# Patient Record
Sex: Female | Born: 1960 | Race: White | Hispanic: No | Marital: Married | Smoking: Never smoker
Health system: Southern US, Community
[De-identification: ages and names within clinical notes are randomized; demographics above are authoritative.]

## PROBLEM LIST (undated history)

## (undated) DIAGNOSIS — R002 Palpitations: Secondary | ICD-10-CM

## (undated) DIAGNOSIS — G4752 REM sleep behavior disorder: Secondary | ICD-10-CM

## (undated) DIAGNOSIS — R011 Cardiac murmur, unspecified: Secondary | ICD-10-CM

## (undated) DIAGNOSIS — G4733 Obstructive sleep apnea (adult) (pediatric): Secondary | ICD-10-CM

## (undated) DIAGNOSIS — B029 Zoster without complications: Secondary | ICD-10-CM

## (undated) HISTORY — DX: Palpitations: R00.2

## (undated) HISTORY — PX: TONSILLECTOMY: SUR1361

## (undated) HISTORY — PX: TOTAL ABDOMINAL HYSTERECTOMY: SHX209

## (undated) HISTORY — DX: Zoster without complications: B02.9

## (undated) HISTORY — DX: Obstructive sleep apnea (adult) (pediatric): G47.33

## (undated) HISTORY — DX: REM sleep behavior disorder: G47.52

## (undated) HISTORY — DX: Cardiac murmur, unspecified: R01.1

---

## 2000-05-16 ENCOUNTER — Ambulatory Visit (HOSPITAL_COMMUNITY): Admission: RE | Admit: 2000-05-16 | Discharge: 2000-05-16 | Payer: Self-pay | Admitting: Family Medicine

## 2001-02-14 ENCOUNTER — Encounter (INDEPENDENT_AMBULATORY_CARE_PROVIDER_SITE_OTHER): Payer: Self-pay | Admitting: *Deleted

## 2001-02-14 ENCOUNTER — Ambulatory Visit (HOSPITAL_COMMUNITY): Admission: RE | Admit: 2001-02-14 | Discharge: 2001-02-14 | Payer: Self-pay | Admitting: Gastroenterology

## 2001-07-18 ENCOUNTER — Other Ambulatory Visit: Admission: RE | Admit: 2001-07-18 | Discharge: 2001-07-18 | Payer: Self-pay | Admitting: Obstetrics and Gynecology

## 2001-10-21 DIAGNOSIS — G4730 Sleep apnea, unspecified: Secondary | ICD-10-CM | POA: Insufficient documentation

## 2001-12-12 ENCOUNTER — Ambulatory Visit (HOSPITAL_BASED_OUTPATIENT_CLINIC_OR_DEPARTMENT_OTHER): Admission: RE | Admit: 2001-12-12 | Discharge: 2001-12-12 | Payer: Self-pay | Admitting: *Deleted

## 2002-08-10 ENCOUNTER — Other Ambulatory Visit: Admission: RE | Admit: 2002-08-10 | Discharge: 2002-08-10 | Payer: Self-pay | Admitting: Obstetrics and Gynecology

## 2003-03-18 ENCOUNTER — Encounter (INDEPENDENT_AMBULATORY_CARE_PROVIDER_SITE_OTHER): Payer: Self-pay | Admitting: *Deleted

## 2003-03-18 ENCOUNTER — Ambulatory Visit (HOSPITAL_COMMUNITY): Admission: RE | Admit: 2003-03-18 | Discharge: 2003-03-18 | Payer: Self-pay | Admitting: Obstetrics and Gynecology

## 2003-09-21 ENCOUNTER — Other Ambulatory Visit: Admission: RE | Admit: 2003-09-21 | Discharge: 2003-09-21 | Payer: Self-pay | Admitting: Obstetrics and Gynecology

## 2003-09-27 ENCOUNTER — Encounter: Admission: RE | Admit: 2003-09-27 | Discharge: 2003-09-27 | Payer: Self-pay | Admitting: Obstetrics and Gynecology

## 2003-12-31 ENCOUNTER — Ambulatory Visit (HOSPITAL_BASED_OUTPATIENT_CLINIC_OR_DEPARTMENT_OTHER): Admission: RE | Admit: 2003-12-31 | Discharge: 2003-12-31 | Payer: Self-pay | Admitting: Internal Medicine

## 2005-01-31 ENCOUNTER — Ambulatory Visit: Payer: Self-pay | Admitting: Internal Medicine

## 2005-04-26 ENCOUNTER — Other Ambulatory Visit: Admission: RE | Admit: 2005-04-26 | Discharge: 2005-04-26 | Payer: Self-pay | Admitting: Obstetrics and Gynecology

## 2005-10-03 ENCOUNTER — Ambulatory Visit: Payer: Self-pay | Admitting: Internal Medicine

## 2006-01-17 ENCOUNTER — Encounter: Admission: RE | Admit: 2006-01-17 | Discharge: 2006-01-17 | Payer: Self-pay | Admitting: Obstetrics and Gynecology

## 2006-02-11 ENCOUNTER — Ambulatory Visit: Payer: Self-pay | Admitting: Cardiology

## 2006-02-15 ENCOUNTER — Ambulatory Visit: Payer: Self-pay | Admitting: Cardiology

## 2006-04-09 ENCOUNTER — Ambulatory Visit: Payer: Self-pay

## 2006-04-09 ENCOUNTER — Encounter: Payer: Self-pay | Admitting: Cardiovascular Disease

## 2006-04-10 ENCOUNTER — Ambulatory Visit: Payer: Self-pay | Admitting: Cardiology

## 2006-10-28 ENCOUNTER — Encounter: Admission: RE | Admit: 2006-10-28 | Discharge: 2006-10-28 | Payer: Self-pay | Admitting: Obstetrics and Gynecology

## 2007-01-07 ENCOUNTER — Ambulatory Visit (HOSPITAL_COMMUNITY): Admission: RE | Admit: 2007-01-07 | Discharge: 2007-01-08 | Payer: Self-pay | Admitting: Obstetrics and Gynecology

## 2007-01-07 ENCOUNTER — Encounter (INDEPENDENT_AMBULATORY_CARE_PROVIDER_SITE_OTHER): Payer: Self-pay | Admitting: Specialist

## 2007-01-22 ENCOUNTER — Ambulatory Visit: Payer: Self-pay | Admitting: Internal Medicine

## 2007-04-14 ENCOUNTER — Ambulatory Visit: Payer: Self-pay | Admitting: Internal Medicine

## 2007-08-12 ENCOUNTER — Encounter: Admission: RE | Admit: 2007-08-12 | Discharge: 2007-08-12 | Payer: Self-pay | Admitting: Obstetrics and Gynecology

## 2007-09-03 ENCOUNTER — Encounter: Admission: RE | Admit: 2007-09-03 | Discharge: 2007-09-03 | Payer: Self-pay | Admitting: Obstetrics and Gynecology

## 2007-10-28 DIAGNOSIS — I080 Rheumatic disorders of both mitral and aortic valves: Secondary | ICD-10-CM

## 2007-10-28 DIAGNOSIS — H1045 Other chronic allergic conjunctivitis: Secondary | ICD-10-CM

## 2007-10-28 DIAGNOSIS — I4949 Other premature depolarization: Secondary | ICD-10-CM

## 2007-10-28 DIAGNOSIS — G4733 Obstructive sleep apnea (adult) (pediatric): Secondary | ICD-10-CM

## 2007-10-28 DIAGNOSIS — E785 Hyperlipidemia, unspecified: Secondary | ICD-10-CM

## 2007-10-28 DIAGNOSIS — Z8679 Personal history of other diseases of the circulatory system: Secondary | ICD-10-CM | POA: Insufficient documentation

## 2007-12-18 ENCOUNTER — Ambulatory Visit: Payer: Self-pay | Admitting: Internal Medicine

## 2007-12-20 DIAGNOSIS — G4752 REM sleep behavior disorder: Secondary | ICD-10-CM

## 2008-02-25 ENCOUNTER — Encounter: Payer: Self-pay | Admitting: Internal Medicine

## 2008-03-24 ENCOUNTER — Encounter: Payer: Self-pay | Admitting: Internal Medicine

## 2008-05-17 ENCOUNTER — Encounter: Admission: RE | Admit: 2008-05-17 | Discharge: 2008-05-17 | Payer: Self-pay | Admitting: Obstetrics and Gynecology

## 2009-08-12 ENCOUNTER — Encounter: Admission: RE | Admit: 2009-08-12 | Discharge: 2009-08-12 | Payer: Self-pay | Admitting: Obstetrics and Gynecology

## 2010-03-10 ENCOUNTER — Ambulatory Visit: Payer: Self-pay | Admitting: Internal Medicine

## 2010-03-10 LAB — CONVERTED CEMR LAB
Cholesterol, target level: 200 mg/dL
HDL goal, serum: 40 mg/dL
LDL Goal: 160 mg/dL

## 2010-09-11 ENCOUNTER — Telehealth (INDEPENDENT_AMBULATORY_CARE_PROVIDER_SITE_OTHER): Payer: Self-pay | Admitting: *Deleted

## 2010-10-26 NOTE — Progress Notes (Signed)
Summary: clonazepam rx > LMTCB x1  Phone Note Call from Patient Call back at Home Phone [phone]   Caller: Patient Call For: Dr Maple Hudson Summary of Call: Patient phoned stated that her pharmacy [facility] [phone] fax [phone]. Pharmacy told the patient that they have faxed [place] the request for Clonazepam .5 mg patient can be reached at her cell [phone] Initial call taken by: Vedia Coffer,  September 11, 2010 4:05 PM  Follow-up for Phone Call        please advise if ok to refill. Pt last seen on 03-10-2010 with directions to follow-up in one year. Carron Curie CMA  September 11, 2010 4:58 PM   Additional Follow-up for Phone Call Additional follow up Details #1::        OK to refill the clonazepam script we gave in June. Additional Follow-up by: Waymon Budge MD,  September 11, 2010 5:27 PM    Additional Follow-up for Phone Call Additional follow up Details #2::    LMOM TCB for pt to inform her of pending refills.  refills telephoned to [facility] via pharmacist. Boone Master CNA/MA  September 11, 2010 5:36 PM   Spoke with pt and notified that rx was sent.  She was already aware as she picked this up last night.  Follow-up by: Vernie Murders,  September 12, 2010 5:36 PM  Prescriptions: CLONAZEPAM 0.5 MG  TABS (CLONAZEPAM) Take 1-2 tabs at bedtime as needed for occasional use.  #60 x 5   Entered by:   Boone Master CNA/MA   Authorized by:   Waymon Budge MD   Signed by:   Boone Master CNA/MA on 09/11/2010   Method used:   Telephoned to ...       [facility] [employer] [facility] (retail)       [address]       [place], [place]  [address]       Ph: [phone]       Fax: [phone]   RxID:   [id number]

## 2010-10-26 NOTE — Assessment & Plan Note (Signed)
Summary: rov/meds/apc   Primary Provider/Referring Provider:  C. Miguel Aschoff [facility]  CC:  ROV/Meds and pt c/o sleep walking and nightmares even taking clonazepam....Marland Kitchenno other complaints.  History of Present Illness: 12/18/07 OBSTRUCTIVE SLEEP APNEA (ICD-327.23) Returns to follow up chronic difficulty maintaining sleep, complicated by movement, activity and vocalization in sleep thought best explained As REM Behavior Disorder, along with sleep apnea. clonazepam has wporked best of therapies tried, taking it at 830 PM. Stressfull times are still apt to trigger some breakthrough but not intense as in past. Clonazepam may be causing some concentration difficulty at work. Previously tried Vivactil, Tofranil, doxepin. [facility]. NPSG was 12/31/03. Allergies acting up with eyes and nose watering, nasal congestion. Tylenol allergy and sinus product not much help. Med talk done.  March 10, 2010- OSA, REM Behavior Disorder Still sleep walking, screams and cries out in sleep, Denies incontinence or injury.. Varies with the stress level of the day.  Changed job= promotion. Several deaths in family. She doesn't use CPAP. She is much better taking clonazepam than not. 2 tabs may not be enough. Needs to take it at [facility] to avoid hangover next morning. Her best sleep came in allergy season when she added benadryl to clonazepam. Family sleep down the hall to avoid her noise at night.She likes sleeping alone.  Now on propranolol 60 mg daily for palpitations. Discussed beta-blocker tendency to increase dreaming.     Lipid Management History:      Negative NCEP/ATP III risk factors include female age less than 72 years old and non-tobacco-user status.    Preventive Screening-Counseling & Management  Alcohol-Tobacco     Smoking Status: never  Current Medications (verified): 1)  Clonazepam 0.5 Mg  Tabs (Clonazepam) .... Take 1-2 Tabs At Bedtime As Needed For Occasional Use. 2)   Alprazolam 0.25 Mg  Tabs (Alprazolam) .Marland Kitchen.. 1 At Bedtime As Needed  Allergies (verified): 1)  Asa 2)  Nsaids 3)  Sulfa  Past History:  Family History: Last updated: 03/10/2010 Parents living Father was a sleep waljer into his teens Mother -active dreamer  Social History: Last updated: 03/10/2010 Patient never smoked.  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 Allergic rhinitis Palpitations Migraine  Past Surgical History: Total Abdominal Hysterectomy Tonsillectomy  Family History: Parents living Father was a sleep waljer into his teens Mother -active dreamer  Social History: Patient never smoked.  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  The patient denies anorexia, fever, weight loss, vision loss, decreased hearing, hoarseness, chest pain, syncope, dyspnea on exertion, peripheral edema, prolonged cough, headaches, hemoptysis, abdominal pain, melena, hematochezia, and severe indigestion/heartburn.    Vital Signs:  Patient profile:   50 year old female Height:      68 inches Weight:      208 pounds BMI:     31.74 O2 Sat:      99 % on Room air Pulse rate:   63 / minute BP sitting:   128 / 80  (left arm) Cuff size:   regular  Vitals Entered By: Denna Haggard, CMA (March 10, 2010 1:37 PM)  O2 Sat at  Rest %:  99% O2 Flow:  Room air  Reason for Visit Rov/ meds  Physical Exam  Additional Exam:  General: A/Ox3; pleasant and cooperative, NAD, weight gain SKIN: no rash, lesions NODES: no lymphadenopathy HEENT: /AT, EOM- WNL, Conjuctivae- clear, PERRLA, TM-WNL, Nose- clear, Throat- clear and wnl, Mallampati  III NECK: Supple w/ fair ROM, JVD- none, normal  carotid impulses w/o bruits Thyroid- normal to palpation CHEST: Clear to P&A HEART: RRR, no m/g/r heard ABDOMEN: Soft and nl; nml bowel sounds; no organomegaly or masses noted ZOX:WRUE, nl pulses, no edema  NEURO: Grossly intact to observation      Impression & Recommendations:  Problem # 1:  Hx of SLEEP APNEA (ICD-780.57)  Not using CPAP. No feedback from family about snoring or etc. I discussed this as a comonent of her sleep dysfunction. She is so active in sleep she can't keep device on. Not interested in surgery option. Weight gain discouraged.  Orders: Est. 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 We will see if addition of a shorter acting sleep med can help, taken at bedtime, whiel she continues to take Clonazepam a couple of hours earlier.  Orders: Est. Patient Level IV (81191)  Problem # 3:  ALLERGIC CONJUNCTIVITIS (ICD-372.14) Assessment: Comment Only  Orders: Est. Patient Level IV (47829)  Medications Added to Medication List This Visit: 1)  Propranolol Hcl 60 Mg Tabs (Propranolol hcl) .Marland Kitchen.. 1 at bedtime 2)  Temazepam 15 Mg Caps (Temazepam) .Marland Kitchen.. 1-2 for sleep as directed  Lipid Assessment/Plan:      Based on NCEP/ATP III, the patient's risk factor category is "0-1 risk factors".  The patient's lipid goals are as follows: Total cholesterol goal is 200; LDL cholesterol goal is 160; HDL cholesterol goal is 40; Triglyceride goal is 150.     Patient Instructions: 1)  Please schedule a follow-up appointment in 1 year. 2)  Try taqking Temazepam at bedtime, to create an extra layer of sedation over the livliest part of your night Prescriptions: CLONAZEPAM 0.5 MG  TABS (CLONAZEPAM) Take 1-2 tabs at bedtime as needed for occasional use.  #60 x 5   Entered and Authorized by:   Waymon Budge MD    Signed by:   Waymon Budge MD on 03/10/2010   Method used:   Print then Give to Patient   RxID:   [id number] TEMAZEPAM 15 MG CAPS (TEMAZEPAM) 1-2 for sleep as directed  #60 x 5   Entered and Authorized by:   Waymon Budge MD   Signed by:   Waymon Budge MD on 03/10/2010   Method used:   Print then Give to Patient   RxID:   [id number]

## 2011-02-06 NOTE — Assessment & Plan Note (Signed)
[facility]                             PULMONARY OFFICE NOTE   Christine Knapp                     MRN:          [id number]  DATE:04/14/2007                            DOB:          07-03-1961    PROBLEM:  1. Obstructive sleep apnea.  2. Rapid eye movement behavior disorder.  3. Allergic conjunctivitis.  4. Palpation and PPC.  5. Mild mitral regurgitation.  6. Hyperlipidemia.   HISTORY:  Now 3 months since last here.  Clonazepam has remained her  best drug for preventing active behavior during dreams.  She has found  she can adjust her dose using 0.5 mg pills to take 1 or 2.  If she takes  2 for several nights in a row she begins to accumulate daytime  sleepiness.  Sometimes 1 is not sufficient to control her dream behavior  but alternating between 1 and 2 seems to work nicely.  She had an MRI  for her migraine and understands nothing remarkable was seen.  Overall  she is doing much better with markedly reduced incidents of screaming in  sleep and sleep walking.   MEDICATIONS:  1. Topamax 100 mg.  2. Klonopin 0.5 mg one or two at bedtime.  3. Imitrex.   NO MEDICATION ALLERGY.   OBJECTIVE:  Weight 198 pounds, blood pressure 140/84, pulse 75, room air  saturation 99%.  She is alert, pleasant, interactive, and appropriate.  NEUROLOGIC:  Unremarkable to observation.  Nasal airway is clear, there is no nystagmus.  Voice quality is normal.  No stridor, no thyromegaly.  HEART:  Sounds are regular without murmur or gallop.   IMPRESSION:  1. Rapid eye movement behavior disorder.  2. Obstructive sleep apnea.  3. Excessive daytime somnolence related to sleep fragmentation and      disruption.   PLAN:  1. Continue clonazepam 0.5 mg taking one or two at bedtime p.r.n.  2. Daytime sleepiness is relatively minor for her.  We have discussed      sleep hygiene,      appropriate use of caffeine.  Her sleep study in 2005 had not shown  significant sleep apnea with an index of 3 per hour.  We are going      to schedule return one year, earlier p.r.n.     Clinton D. Maple Hudson, MD, Tonny Bollman, FACP  Electronically Signed    CDY/MedQ  DD: 04/15/2007  DT: 04/16/2007  Job #: [id number]   cc:   C. Duane Lope, M.D.

## 2011-02-09 NOTE — Op Note (Signed)
[name]              ACCOUNT NO.:  [id number]   MEDICAL RECORD NO.:  [id number]          PATIENT TYPE:  AMB   LOCATION:  SDC                           FACILITY:  [facility]   PHYSICIAN:  Juluis Mire, M.D.   DATE OF BIRTH:  12/13/1960   DATE OF PROCEDURE:  01/07/2007  DATE OF DISCHARGE:                               OPERATIVE REPORT   PREOPERATIVE DIAGNOSIS:  Abnormal uterine bleeding.   POSTOPERATIVE DIAGNOSIS:  Abnormal uterine bleeding.   PROCEDURES:  Laparoscopic-assisted vaginal hysterectomy.   SURGEON:  Juluis Mire, M.D.   ASSISTANT:  Guy Sandifer. Henderson Cloud, M.D.   ANESTHESIA:  Was general.   ESTIMATED BLOOD LOSS:  400 mL.   PACKS AND DRAINS:  None.   INTRAOPERATIVE BLOOD REPLACED:  None.   COMPLICATIONS:  None.   INDICATIONS:  Dictated in the history and physical.   PROCEDURE AS FOLLOWS:  The patient was taken to the OR and placed in  supine position.  After satisfactory level of general tracheal  anesthesia obtained, the patient placed in dorsal lithotomy position  using the Allen stirrups.  The abdomen, perineum and vagina were prepped  out with Betadine.  Bladder was emptied by in-and-out catheterization.  A Hulka tenaculum was put in place.  The patient was then draped in a  sterile field.  Subumbilical incision was made with a knife and extended  through subcutaneous tissue.  Fascia was identified and entered sharply  and incision in the fascia extended laterally.  Peritoneum was entered  with blunt pressure.  A taut open laparoscopic trocar was put in place  and secured.  Laparoscope was introduced.  A 5-mm trocar was put in  place in the suprapubic area under direct visualization.  Visualization  revealed the uterus to all normal.  Tubes and ovaries were unremarkable.  Appendix was either retrocecal or difficult to visualize.  Upper abdomen  including the liver tip and the gallbladder were clear.  Both lateral  gutters were clear otherwise.  Next,  using the gyrus, both utero-ovarian  pedicles were cauterized and incised, and both round ligaments were  cauterized and incised.  We had good freeing up the adnexa.  At this  point, decision was to proceed vaginally.   The patient's legs were repositioned.  The Hulka tenaculum was then  removed.  Cervix was grasped with Christella Hartigan tenaculum.  Cul-de-sac was  entered sharply.  Both uterosacral ligaments were clamped, cut and  suture ligated with 0 Vicryl.  The reflection of the vaginal mucosa  anteriorly was incised, and the bladder was dissected superiorly.  Paracervical tissue was then clamped, cut and suture ligated with 0  Vicryl.  The reflection of the vaginal mucosa anteriorly was incised and  retractors put in place to retract the bladder superiorly.  Using  clamped, cut and tie technique with suture ligatures of 0 Vicryl, the  parametrium was serially separated from the sided of the uterus.  Uterus  was then flipped, and the remainder of the pedicles were clamped and  cut.  Uterus and cervix were passed off the operative field and sent to  pathology.  Held pedicles secured with free tie of 0 Vicryl.  Uterosacral plication stitch of 0 Vicryl was put in place and secured.  Vaginal mucosa was reapproximated in a vertical fashion with interrupted  figure-of-eights of 0 Monocryl.  At this point in time, Foley was placed  to straight drain.  Clear and adequate urine was obtained.   The patient's legs were repositioned.  Laparoscope was reintroduced.  Visualization of the pelvic cavity revealed overall good hemostasis at  the ovarian sites.  There was some oozing of the vaginal cuff brought  under control with the bipolar.  At this point in time, we had good  hemostasis.  No evidence of injury to adjacent organs.  The abdomen was  deflated of its carbon dioxide.  All trocars removed.  The patient was  taken out of dorsal lithotomy position.  Once alert, extubated and  transferred to  recovery in good condition.  Sponge, instrument and  needle count was reported as correct by the circulating nurse x2.  Foley  catheter remained clear.      Juluis Mire, M.D.  Electronically Signed     JSM/MEDQ  D:  01/07/2007  T:  01/07/2007  Job:  [id number]

## 2011-02-09 NOTE — H&P (Signed)
NAME:  Christine Knapp NO.:  [id number]   MEDICAL RECORD NO.:  [id number]          PATIENT TYPE:  AMB   LOCATION:  SDC                           FACILITY:  [facility]   PHYSICIAN:  Juluis Mire, M.D.   DATE OF BIRTH:  [birth year]   DATE OF ADMISSION:  01/07/2007  DATE OF DISCHARGE:                              HISTORY & PHYSICAL   The patient is a 50 year old gravida 2, para 2, married female who  presents for laparoscopic-assisted vaginal hysterectomy.  In relation to  the present admission, the patient has had a previous bilateral tubal  ligation as well as a cryoablation.  Her cycles have become every 28-35  days.  She is having increasing flow with associated dysmenorrhea.  In  view of increasing problems she has had with her cycles, we discussed  the options including repeat ablation or attempt at hormonal management.  The patient has presenting now for laparoscopic-assisted vaginal  hysterectomy.   It is also noted that her last Pap revealed atypical squamous cells that  were of undetermined significance.  She was positive for HPV.  We did a  colposcopic evaluation that was basically unremarkable.   IN TERMS OF ALLERGIES, SHE IS ALLERGIC IBUPROFEN, ASPIRIN AND CELEBREX  AND RELATED DRUGS.   MEDICATIONS:  Topamax and temazepam.   PAST MEDICAL HISTORY:  She has got a history of mitral valve prolapse  with slight mitral regurgitation.  Does take SBE prophylaxis.  Otherwise, usual childhood diseases without significant sequelae.   PAST SURGICAL HISTORY:  She has had previous tonsillectomy.  She has had  previous adenoidectomy, and as noted above, she had a previous  hysteroscopy and cryoablation.   PAST OBSTETRICAL HISTORY:  She has had 2 vaginal deliveries.   FAMILY HISTORY:  Noncontributory.   SOCIAL HISTORY:  Reveals no tobacco or alcohol use.   REVIEW OF SYSTEMS:  Noncontributory.   PHYSICAL EXAMINATION:  The patient was afebrile with stable vital  signs.  HEENT:  The patient was normocephalic.  Pupils equal, round and react to  light and accommodation.  Extraocular movements were intact.  Sclerae  and conjunctivae were clear.  Oropharynx clear.  NECK:  Without thyromegaly.  BREASTS:  No discrete masses.  LUNGS:  Clear.  CARDIOVASCULAR SYSTEM:  Regular rhythm and rate without murmurs or  gallops.  ABDOMINAL EXAM:  Was benign.  No masses, organomegaly or tenderness.  PELVIC:  Normal external genitalia.  Vaginal mucosa was clear.  Cervix  unremarkable.  Uterus upper limits of normal size.  Adnexa unremarkable.  EXTREMITIES:  Trace edema.  NEUROLOGICAL EXAM:  Was grossly normal limits.   IMPRESSION:  Menorrhagia unresponsive to cryoablation.   PLAN:  The patient will undergo a laparoscopic-assisted vaginal  hysterectomy per patient's request.  The ovaries will be left in place.  The potential risk for development of ovarian cancer have been  discussed.  The risks of surgery explained including the risk of  infection.  The risk of hemorrhage that could require transfusion with  the risk of AIDS or hepatitis.  The risk of injury to adjacent organs  including  bladder, bowel, ureters that could require further exploratory  surgery.  The risk of deep venous thrombosis and pulmonary emboli.  The  patient expressed understanding of indications and risks.      Juluis Mire, M.D.  Electronically Signed     JSM/MEDQ  D:  01/07/2007  T:  01/07/2007  Job:  [id number]

## 2011-02-09 NOTE — Op Note (Signed)
NAME:  Christine Knapp                        ACCOUNT NO.:  [id number]   MEDICAL RECORD NO.:  [id number]                   PATIENT TYPE:  AMB   LOCATION:  SDC                                  FACILITY:  [facility]   PHYSICIAN:  Juluis Mire, M.D.                DATE OF BIRTH:  Oct 21, 1960   DATE OF PROCEDURE:  03/18/2003  DATE OF DISCHARGE:                                 OPERATIVE REPORT   PREOPERATIVE DIAGNOSES:  1. Multiparity, desires sterility.  2. Menorrhagia with endometrial polyps.   POSTOPERATIVE DIAGNOSES:  1. Multiparity, desires sterility.  2. Menorrhagia with endometrial polyps.   OPERATIVE PROCEDURE:  1. Laparoscopic bilateral tubal fulguration.  2. Hysteroscopy with resection of multiple endometrial polyps.  3. Endometrial ablation using cryoablation.   SURGEON:  Juluis Mire, M.D.   ANESTHESIA:  General endotracheal.   ESTIMATED BLOOD LOSS:  100 mL.   PACKS AND DRAINS:  None.   INTRAOPERATIVE BLOOD REPLACEMENT:  None.   COMPLICATIONS:  None.   INDICATIONS FOR PROCEDURE:  Dictated in history and physical.   PROCEDURE AS FOLLOWS:  The patient was taken to the operating room, placed  in the supine position.  After satisfactory level of general endotracheal  was obtained, the patient was placed in the dorsal lithotomy position using  the Allen stirrups.  The abdomen, perineum, and vagina were prepped out with  Betadine.  The bladder was emptied by in-and-out catheterization.  A Hulka  tenaculum was put in place and secured.  The patient was draped out for  laparoscopy.  Subumbilical incision made with a knife.  The Veress needle  was introduced into the abdominal cavity.  The abdomen was inflated with  approximately four liters of carbon dioxide.  The operating laparoscope was  introduced.  There was no evidence of injury to adjacent organs.  The uterus  was upper limits of normal size, tubes and ovaries were unremarkable.  No  active processes were noted,  specifically no endometriosis or adhesions.  The upper abdomen including the liver, tip of the gallbladder were clear.  Appendix was visualized and noted to be normal.  Mid segment of each tube  was identified and coagulated for a distance of 3 cm.  Coagulation was  continued until resistance read 0 on the meter.  The same segment of tube  was then recoagulated, completely desiccating the tube.  Coagulation did  extend out to the mesosalpinx.  At the end of the procedure both tubes had  been adequately coagulated.  There was no injury to adjacent organs.  The  abdomen was deflated of carbon dioxide.  The laparoscope was removed.  The  subumbilical incision was closed with interrupted subcuticulars of 4-0  Vicryl.   At this point in time the Hulka tenaculum was removed and the patient's legs  were repositioned.  Speculum was placed in the vaginal vault.  The cervix  was grasped  with a single-tooth tenaculum.  The uterus sounded to  approximately 10 cm.  The cervix was serially dilated to a size 37 Pratt  dilator.  Operative hysteroscope was introduced.  The intrauterine cavity  was distended using sorbitol and multiple endometrial polyps were noted.  These were all resected and sent for pathological review.  We continued  resection until we got down to the basal layer of the endometrium.  At this  point, we sharply curetted the endometrium.  There was no additional tissue  obtained.  Reinspection with the hysteroscope revealed no signs of  perforation or active bleeding.   The CryoGen probe was then put in place and a six minute freeze was  performed on each side of the uterus and a two minute freeze performed in  the lower uterine segment.  At this point in time the single-tooth tenaculum  and speculum had been removed.   The patient was taken out of the dorsal lithotomy position, was alert and  extubated and transferred to recovery in good condition.  Sponge,  instrument, and needle  counts reported as correct by circulating nurse.                                               Juluis Mire, M.D.    JSM/MEDQ  D:  03/18/2003  T:  03/19/2003  Job:  [id number]

## 2011-02-09 NOTE — Assessment & Plan Note (Signed)
[facility]                              CARDIOLOGY OFFICE NOTE   Lita Mains                     MRN:          [id number]  DATE:04/10/2006                            DOB:          07-Feb-1961    PRIMARY CARE PHYSICIAN:  Miguel Aschoff, MD   CLINICAL HISTORY:  Christine Knapp is 50 years old, and works for [employer]  in [place].  She is the niece of Latoria Dry, who is a patient of  mine.  I saw her recently for symptoms of palpitations and atypical chest  pain.  We did an echocardiogram and a stress echo and a Holter monitor.  The  echocardiogram showed borderline cardiac enlargement and a murmur, and mild  mitral regurgitation.  Her stress echo showed no evidence of ischemia.  Her  Holter monitor showed isolated PVCs.  Her laboratory studies showed an  elevated LDL of 151, with an HDL of 42 and a cholesterol of 202.   Since I saw her, she says her symptoms have decreased some and she has less  palpitations.  She cut back on the caffeine.   CURRENT MEDICATIONS:  1.  Temazepam.  2.  Topamax.   PHYSICAL EXAMINATION:  On examination today, her blood pressure is 142/92,  and the pulse is 72 and regular.  There was no venous distention.  Carotid  pulses were full without bruits.  CHEST:  Clear.  CARDIAC:  Rhythm was regular.  No murmurs or gallops.  ABDOMEN:  Soft without organomegaly.  Peripheral pulses are full, and there is no peripheral edema.   IMPRESSION:  1.  Palpitations and PVCs.  2.  Mild mitral regurgitation and borderline cardiac enlargement.  3.  Hyperlipidemia.   RECOMMENDATIONS:  Ms. Dewaine Conger has no evidence of ischemia, and she has only  mild structural heart disease with borderline cardiac enlargement and mild  mitral regurgitation.  I do not think her PVCs represent a significant  threat to her.  We have reassured her about this.  Her main risk profile for  vascular disease is her high cholesterol and her  positive family history.  I  think her risk profile is not high enough to warrant statin therapy at the  present time, and I  recommended she follow a diet that is low in saturated fats and trans fats.  I will plan to see her back on a p.r.n. basis, and she will follow up with  Dr. Tenny Craw.                               Bruce Elvera Lennox Juanda Chance, MD, [facility]    BRB/MedQ  DD:  04/10/2006  DT:  04/11/2006  Job #:  [id number]

## 2011-02-09 NOTE — H&P (Signed)
NAME:  Christine Knapp NO.:  [id number]   MEDICAL RECORD NO.:  [id number]                   PATIENT TYPE:   LOCATION:                                       FACILITY:   PHYSICIAN:  Juluis Mire, M.D.                DATE OF BIRTH:   DATE OF ADMISSION:  DATE OF DISCHARGE:                                HISTORY & PHYSICAL   50 year old gravida 2, para 2, married white female presents for  laparoscopic bilateral tubal ligation, hysteroscopy with cryoablation.   RELATION TO PRESENT ADMISSION:  The patient desires some permanent  sterilization. Alternative points of birth control have been discussed. A  failure rate of 1:200 is quoted. Failures can be in the form of ectopic  pregnancy requiring further surgical management.   Cycles are presently regular. She has 7 days of flow, 4 days being heavy,  changing pads and tampons every 2 hours with clots. With this is some  increasing dysmenorrhea. Saline infusion ultrasound had revealed 2 small  endometrial polyps. She is scheduled to undergo hysteroscopic resection and  cryoablation.   ALLERGIES:  She has no known drug allergies.   MEDICATIONS:  Temazepam and Vivactil.   PAST MEDICAL HISTORY:  Significant in that she has a history of mitral valve  prolapse with slight mitral regurgitation; does need SBE prophylaxis.  Otherwise, usual childhood diseases without any significant sequelae.   PAST SURGICAL HISTORY:  Previous tonsillectomy and adenoidectomy as a child.   OBSTETRICAL HISTORY:  Two spontaneous vaginal deliveries.   FAMILY HISTORY:  Noncontributory.   SOCIAL HISTORY:  No tobacco or alcohol use.   REVIEW OF SYSTEMS:  Noncontributory.   PHYSICAL EXAM:  The patient is afebrile with stable vital signs.  HEENT EXAM: The patient is normocephalic. Pupils are equally round and  reactive to light and accommodation. Extraocular movements are intact.  Sclerae and conjunctivae are clear. Oropharynx  is clear.  NECK: Without thyromegaly.  BREASTS: No discrete masses.  LUNGS: Clear.  CARDIAC SYSTEM:  Regular rate with a 2/6 systolic ejection murmur. No clicks  or gallops.  ABDOMINAL EXAM:  Benign. No masses, organomegaly, or tenderness.  PELVIC: Normal external genitalia. Vaginal mucosa clear. Cervix  unremarkable. Uterus normal size, shape, and contour. Adnexa free of masses  or tenderness.  EXTREMITIES: Trace edema.  NEUROLOGIC EXAM:  Grossly within normal limits.   IMPRESSION:  1. Menorrhagia with evidence of endometrial polyps.  2. Multiparity, desires sterility.  3. Mitral valve prolapse.   PLAN:  SBE prophylaxis. Will proceed with the above-noted surgery. Success  rates for cryoablation of 70% are quoted. Further management has been  discussed. Risks of procedure explained, including the risk of infection.  Risk of vascular injury that could lead to hemorrhage requiring transfusion  with the risk of AIDS or hepatitis. Risk of injury to adjacent organs  including bladder/bowel that could require further exploratory surgery. Risk  of deep venous thrombosis and pulmonary embolus. The patient expresses  understanding of indications and risks.                                                Juluis Mire, M.D.    JSM/MEDQ  D:  03/18/2003  T:  03/18/2003  Job:  [id number]

## 2011-02-09 NOTE — Assessment & Plan Note (Signed)
[facility]                             PULMONARY OFFICE NOTE   Christine Knapp                     MRN:          [id number]  DATE:01/22/2007                            DOB:          May 06, 1961    PULMONARY OFFICE FOLLOW-UP:   PROBLEM LIST:  1. Obstructive sleep apnea.  2. REM behavior disorder.  3. Allergic conjunctivitis.  4. Palpitations and PVC.  5. Mild mitral regurgitation.  6. Hyperlipidemia.   HISTORY:  She had cardiology evaluation noting mild cardiac enlargement,  mild mitral regurgitation, treated with reassurance and attention to  diet, will follow up p.r.n.  She had a neurology evaluation with Dr.  Nash Knapp for migraine and her Topamax dose was increased.  Since last  year, over a year ago, she says she has had less nighttime disturbance,  less running down the hall screaming.  Sleep onset is no problem, but  she always wakes during the night at some point and then cannot get back  to sleep.  She will stay in bed dozing off and on.  Naps on weekends are  helpful but she is not able to nap at work.  She had called asking to  switch back from temazepam to clonazepam 0.5 mg, and we brought her in  at that point.  She says with clonazepam her head feels clearer,  refreshed compared with temazepam.  I discussed medications, sleep  hygiene, half-life issues of these drugs, and alternatives.   MEDICATIONS:  1. Topamax 100 mg.  2. Clonazepam 0.5 mg at h.s.  3. Imitrex p.r.n.   No medication allergy.   OBJECTIVE:  Weight 198 pounds, BP 118/64, pulse regular at 70, room air  saturation 100%.  She is alert and cooperative.  Neurologic unremarkable  to observation.  Nose and throat are clear.  Breathing is unlabored.  Pulse regular.  Her mood is pleasant.   IMPRESSION:  1. REM behavior disorder with potential for partial arousals while on      medication.  2. Insomnia with mild sleep apnea.  Note that we have previously tried   a number of approaches, including Vivactil, Tofranil, and doxepin.      She had disliked CPAP and never stayed with it.   PLAN:  1. Okay to use clonazepam 0.5 mg one or two h.s. p.r.n.  2. Consider a No-Doz caplet if necessary for daytime sleepiness while      at work.  3. Schedule return 1 month, earlier p.r.n.  4. Attention to sleep hygiene.     Christine D. Maple Hudson, MD, Christine Knapp, FACP  Electronically Signed    CDY/MedQ  DD: 01/22/2007  DT: 01/23/2007  Job #: [id number]   cc:   Christine Knapp, M.D.

## 2011-02-09 NOTE — Discharge Summary (Signed)
[name]              ACCOUNT NO.:  [id number]   MEDICAL RECORD NO.:  [id number]          PATIENT TYPE:  OIB   LOCATION:  9308                          FACILITY:  [facility]   PHYSICIAN:  Juluis Mire, M.D.   DATE OF BIRTH:  Sep 29, 1960   DATE OF ADMISSION:  01/07/2007  DATE OF DISCHARGE:  01/08/2007                               DISCHARGE SUMMARY   ADMISSION DIAGNOSIS:  Adenomyosis.   POSTOPERATIVE DIAGNOSIS:  Adenomyosis.  Pathology pending.   OPERATIVE PROCEDURE:  Laparoscopic-assisted vaginal hysterectomy.   For complete history and physical, please see dictated note.   COURSE IN THE HOSPITAL:  The patient underwent above-noted surgery  without complications, postoperatively was doing well.  Discharged home  the first postoperative day; at that time afebrile with stable vital  signs.  Abdomen was soft, nontender.  Bowel sounds were active.  All  incisions were intact.  She was voiding without difficulty and having  minimal bleeding.  Her CBC is still pending.   In terms of complication, none were encountered during stay in hospital.  The patient discharged home in stable condition.   DISPOSITION:  The patient is to avoid heavy lifting, vaginal entry or  driving a car.  She is to report signs of infection, nausea, vomiting,  increased abdominal pain, active vaginal bleeding, signs of deep venous  thrombosis or pulmonary embolus.  Discharged home on Percocet as needed  for pain.   PLAN:  Follow-up in the office in approximately 1 week.      Juluis Mire, M.D.  Electronically Signed     JSM/MEDQ  D:  01/08/2007  T:  01/08/2007  Job:  [id number]

## 2011-02-09 NOTE — Procedures (Signed)
[place]  Patient:    Christine Knapp                     MRN: [id number] Proc. Date: 02/14/01 Adm. Date:  [id number] Attending:  Nelda Marseille CC:         Tomi Bamberger, [facility]   Procedure Report  PROCEDURE:  Colonoscopy with polypectomy.  INDICATION:  Patient with a strong family history of colon cancer and colon polyps on both sides of the family.  Consent was signed after risks, benefits, methods, and options thoroughly discussed in the office.  MEDICATIONS:  Demerol 100 mg, Versed 10 mg.  DESCRIPTION OF PROCEDURE:  Rectal inspection was pertinent for external hemorrhoids.  Digital exam was negative.  Video pediatric colonoscope was inserted and moderately easily advanced around the colon to the cecum.  This did require rolling her on her back and abdominal pressure.  The cecum was identified by the appendiceal orifice and the ileocecal valve.  No obvious abnormality was seen on insertion.  The scope was inserted a short way into th terminal ileum, which was normal.  Photo documentation was obtained.  The scope was slowly withdrawn.  The TI was normal.  The cecum and the ascending had a fair prep, did require lots of washing and suctioning for adequate visualization.  The rest of the prep was adequate.  On slow withdrawal through the colon, however, the cecum, ascending, transverse, and descending were all normal.  The sigmoid was tortuous, but no abnormalities were seen.  Back in the rectum, three tiny polyps were seen and were all hot biopsied x 1.  The scope was retroflexed, revealing some internal hemorrhoids.  The scope was straightened, air was suctioned, the scope removed.  The patient tolerated the procedure well.  There was no obvious immediate complication.  ENDOSCOPIC DIAGNOSES: 1. Internal-external small hemorrhoids. 2. Three tiny rectal polyps, hot biopsied. 3. Tortuous sigmoid. 4. Otherwise  within normal limits to the cecum and terminal ileum.  PLAN:  Await pathology to determine future colonic screening.  Yearly rectals and guaiacs per primary care.  Happy to see back sooner p.r.n.  Will hold aspirin and nonsteroidals for one week, and the customary postpolypectomy instructions. DD:  02/14/01 TD:  02/14/01 Job: [id number]

## 2011-02-12 ENCOUNTER — Other Ambulatory Visit: Payer: Self-pay | Admitting: *Deleted

## 2011-02-12 MED ORDER — CLONAZEPAM 0.5 MG PO TABS: ORAL_TABLET | ORAL | Status: DC

## 2011-02-12 NOTE — Telephone Encounter (Signed)
Per CY-okay to refill x 5  

## 2011-02-12 NOTE — Telephone Encounter (Signed)
RX called to the pharmacy

## 2011-02-12 NOTE — Telephone Encounter (Signed)
Pt last OV 03/10/2010 and to f/u in 1 year. No pending appointments. Pls advise on refill. Clonazepam 0.5 mg, take 1 to 2 at bedtime as needed.

## 2011-02-20 ENCOUNTER — Telehealth: Payer: Self-pay | Admitting: Internal Medicine

## 2011-02-20 NOTE — Telephone Encounter (Signed)
Pt states having problems with sleep even taking the clonazepam, having nightmares more frequently, she is currently on clonazepam .5mg  2 tabs at bedtime.  this seems to be the same complaints she had 02/2010 at her last ov with dr young, pt states she would like appt to discuss this and she is due for appt in June for 1 yr f/u --pls advise of a time to offer pt

## 2011-02-20 NOTE — Telephone Encounter (Signed)
I have schedule patient to come in on Thursday 02-22-2011 at 130(first pt of the afternoon) to discuss sleep issues with CY. Left message for pt to call back and get this information-if this date/time doesn't work with patient please let me know asap. Thanks.

## 2011-02-21 ENCOUNTER — Encounter: Payer: Self-pay | Admitting: Internal Medicine

## 2011-02-21 NOTE — Telephone Encounter (Signed)
Noted by triage, thanks.  Will sign off on message.

## 2011-02-21 NOTE — Telephone Encounter (Signed)
Patient phoned stated she was returning a call to triage wanted to let them know she is aware of appointment on 02/22/11 at 1:30 and she will be here. No reason to leave another message unless it is for something different. [place]

## 2011-02-21 NOTE — Telephone Encounter (Signed)
lmtcb again today  Philipp Deputy, LPN

## 2011-02-22 ENCOUNTER — Ambulatory Visit (INDEPENDENT_AMBULATORY_CARE_PROVIDER_SITE_OTHER): Payer: 59 | Admitting: Internal Medicine

## 2011-02-22 ENCOUNTER — Encounter: Payer: Self-pay | Admitting: Internal Medicine

## 2011-02-22 VITALS — BP 130/80 | HR 68 | Ht 68.0 in | Wt 215.0 lb

## 2011-02-22 DIAGNOSIS — G4733 Obstructive sleep apnea (adult) (pediatric): Secondary | ICD-10-CM

## 2011-02-22 DIAGNOSIS — G4752 REM sleep behavior disorder: Secondary | ICD-10-CM

## 2011-02-22 MED ORDER — ESZOPICLONE 3 MG PO TABS: 3.0000 mg | ORAL_TABLET | Freq: Every evening | ORAL | Status: DC | PRN

## 2011-02-22 NOTE — Progress Notes (Signed)
  Subjective:    Patient ID: Christine Knapp, female    DOB: 11-17-1960, 50 y.o.   MRN: [id number]  HPI 02/22/11- 68 yoF followed for OSA, REM Behavior Disorder, complicated by hx of mitral regurgitation and palpitations.  Last here March 10, 2010- had changed jobs then and wasn't using CPAP.  She still sleeps alone, but disturbs the household by active screaming, sleepwalking through her nightmares. She remembers a lot of it the next day.  Previously tried Vivactil, Tofranil, doxepin without benefit. Stopped using clonazepam and alprazolam- not helpful. During day she is "used to feeling tired". No headaches since septoplasty 8 weeks ago.  Review of Systems Constitutional:   No weight loss, night sweats,  Fevers, chills, fatigue, lassitude. HEENT:  Headaches better. ,  Difficulty swallowing,  Tooth/dental problems,  Sore throat,                No sneezing, itching, ear ache, nasal congestion, post nasal drip,   CV:  No chest pain,  Orthopnea, PND, swelling in lower extremities, anasarca, dizziness, palpitations  GI  No heartburn, indigestion, abdominal pain, nausea, vomiting, diarrhea, change in bowel habits, loss of appetite  Resp: No shortness of breath with exertion or at rest.  No excess mucus, no productive cough,  No non-productive cough,  No coughing up of blood.  No change in color of mucus.  No wheezing.  No chest wall deformity  Skin: no rash or lesions.  GU: no dysuria, change in color of urine, no urgency or frequency.  No flank pain.  MS:  No joint pain or swelling.  No decreased range of motion.  No back pain.  Psych:  No change in mood or affect. No depression or anxiety.  No memory loss.      Objective:   Physical Exam General- Alert, Oriented, Affect-appropriate, Distress- none acute   Has gained weight since last here.   Skin- rash-none, lesions- none, excoriation- none  Lymphadenopathy- none  Head- atraumatic  Eyes- Gross vision intact, PERRLA, conjunctivae  clear secretions  Ears- Hearing, canals, Tm - normal  Nose- Clear, No-Septal dev, mucus, polyps, erosion, perforation   Throat- Mallampati IV , mucosa clear , drainage- none, tonsils- atrophic  Neck- flexible , trachea midline, no stridor , thyroid nl, carotid no bruit  Chest - symmetrical excursion , unlabored     Heart/CV- RRR , no murmur , no gallop  , no rub, nl s1 s2                     - JVD- none , edema- none, stasis changes- none, varices- none     Lung- clear to P&A, wheeze- none, cough- none , dullness-none, rub- none     Chest wall-   Abd- tender-no, distended-no, bowel sounds-present, HSM- no  Br/ Gen/ Rectal- Not done, not indicated  Extrem- cyanosis- none, clubbing, none, atrophy- none, strength- nl  Neuro- grossly intact to observation        Assessment & Plan:

## 2011-02-22 NOTE — Assessment & Plan Note (Addendum)
I want to see if OSA is waking her in REM to aggravate this problem. We will try CPAP again, adding Lunesta.  I would like Dr Tenny Craw to consider changing from propranalol for palpitations, to see if maybe something else- would work, because beta blockers can predispose to nightmares.

## 2011-02-22 NOTE — Assessment & Plan Note (Signed)
I would like to retry her with CPAP, using autotitration and a newer mask. Her original study was 2005.

## 2011-02-22 NOTE — Patient Instructions (Signed)
New order CPAP autotitration  5-15 cwp for pressure recommendation, x 2 weeks, mask of choice, heated humidifier  Samples/ script Lunesta 3 mg,  1 at bedtime for sleep  Ask Dr Tenny Craw about getting off of propranalol, since beta blocker drugs can cause nightmares. Maybe a calcium channel blocker would work.

## 2011-03-06 ENCOUNTER — Telehealth: Payer: Self-pay | Admitting: Internal Medicine

## 2011-03-06 ENCOUNTER — Encounter: Payer: Self-pay | Admitting: *Deleted

## 2011-03-06 MED ORDER — ZOLPIDEM TARTRATE 10 MG PO TABS: 10.0000 mg | ORAL_TABLET | Freq: Every evening | ORAL | Status: DC | PRN

## 2011-03-06 NOTE — Telephone Encounter (Signed)
Per CY-ok to try Zolpidem 10 mg #30 take 1 po qhs prn sleep with 5 refills.

## 2011-03-06 NOTE — Telephone Encounter (Signed)
[id number] 

## 2011-03-06 NOTE — Telephone Encounter (Signed)
Rx was called to [facility] in [place] and pt aware.

## 2011-03-06 NOTE — Telephone Encounter (Signed)
Returning call can be reached at [phone].Christine Knapp

## 2011-03-06 NOTE — Telephone Encounter (Signed)
Spoke with pt.  She states that [place] was going to cost over $100 dollars even with insurance. She states that pharmacist rec maybe trying zolpidem? She states has not tried this med before. Please advise thanks! Allergies  Allergen Reactions  . Aspirin   . Nsaids   . Sulfonamide Derivatives

## 2011-03-29 ENCOUNTER — Telehealth: Payer: Self-pay | Admitting: Internal Medicine

## 2011-03-29 MED ORDER — CLONAZEPAM 0.5 MG PO TABS: ORAL_TABLET | ORAL | Status: DC

## 2011-03-29 NOTE — Telephone Encounter (Signed)
Per CY-okay to D/C ambien and restart Clonazepam 0.5mg  #60 take 1-2 po qhs prn sleep with 5 refills. I have called the pharmacy on file and patient. Updated patients med list as well.

## 2011-04-12 ENCOUNTER — Ambulatory Visit: Payer: 59 | Admitting: Internal Medicine

## 2011-05-29 ENCOUNTER — Ambulatory Visit: Payer: 59 | Admitting: Internal Medicine

## 2011-07-06 ENCOUNTER — Ambulatory Visit: Payer: 59 | Admitting: Internal Medicine

## 2011-08-10 ENCOUNTER — Encounter: Payer: Self-pay | Admitting: Internal Medicine

## 2011-08-10 ENCOUNTER — Ambulatory Visit (INDEPENDENT_AMBULATORY_CARE_PROVIDER_SITE_OTHER): Payer: 59 | Admitting: Internal Medicine

## 2011-08-10 VITALS — BP 128/84 | HR 64 | Ht 68.0 in | Wt 213.8 lb

## 2011-08-10 DIAGNOSIS — G4733 Obstructive sleep apnea (adult) (pediatric): Secondary | ICD-10-CM

## 2011-08-10 DIAGNOSIS — G4752 REM sleep behavior disorder: Secondary | ICD-10-CM

## 2011-08-10 MED ORDER — CLONAZEPAM 0.5 MG PO TABS: ORAL_TABLET | ORAL | Status: DC

## 2011-08-10 NOTE — Progress Notes (Signed)
Patient ID: Christine Knapp, female    DOB: October 27, 1960, 50 y.o.   MRN: [id number]  HPI 02/22/11- 47 yoF followed for OSA, REM Behavior Disorder, complicated by hx of mitral regurgitation and palpitations.  Last here March 10, 2010- had changed jobs then and wasn't using CPAP.  She still sleeps alone, but disturbs the household by active screaming, sleepwalking through her nightmares. She remembers a lot of it the next day.  Previously tried Vivactil, Tofranil, doxepin without benefit. Stopped using clonazepam and alprazolam- not helpful. During day she is "used to feeling tired". No headaches since septoplasty 8 weeks ago.   08/10/11- 49 yoF followed for OSA, REM Behavior Disorder, complicated by hx of mitral regurgitation and palpitations.  She declines flu vaccine. Overall has been sleeping better. CPAP definitely helps. Autotitration showed good control at 10 CWP and she is using it all night every night. We discussed off of versus fixed pressure settings. An occasional episode when she takes the mask off in her sleep and goes sleepwalking but this is much less often compared with a 5/7 nights average before CPAP. Taking her propranolol in the morning. Clonazepam helps, taken 3 hours before bedtime. She still has some sleep latency but it is acceptable.  Review of Systems Constitutional:   No-   weight loss, night sweats, fevers, chills, fatigue, lassitude. HEENT:   No-  headaches, difficulty swallowing, tooth/dental problems, sore throat,       No-  sneezing, itching, ear ache, nasal congestion, post nasal drip,  CV:  No-   chest pain, orthopnea, PND, swelling in lower extremities, anasarca,                                  dizziness, palpitations Resp: No-   shortness of breath with exertion or at rest.              No-   productive cough,  No non-productive cough,  No- coughing up of blood.              No-   change in color of mucus.  No- wheezing.   Skin: No-   rash or lesions. GI:  No-    heartburn, indigestion, abdominal pain, nausea, vomiting, diarrhea,                 change in bowel habits, loss of appetite GU: No-   dysuria, change in color of urine, no urgency or frequency.  No- flank pain. MS:  No-   joint pain or swelling.  No- decreased range of motion.  No- back pain. Neuro-     nothing unusual Psych:  No- change in mood or affect. No depression or anxiety.  No memory loss.      Objective:   Physical Exam General- Alert, Oriented, Affect-appropriate, Distress- none acute, overweight Skin- rash-none, lesions- none, excoriation- none Lymphadenopathy- none Head- atraumatic            Eyes- Gross vision intact, PERRLA, conjunctivae clear secretions            Ears- Hearing, canals-normal            Nose- Clear, no-Septal dev, mucus, polyps, erosion, perforation             Throat- Mallampati III-IV , mucosa clear , drainage- none, tonsils- atrophic Neck- flexible , trachea midline, no stridor , thyroid nl, carotid no bruit Chest - symmetrical  excursion , unlabored           Heart/CV- RRR , no murmur , no gallop  , no rub, nl s1 s2                           - JVD- none , edema- none, stasis changes- none, varices- none           Lung- clear to P&A, wheeze- none, cough- none , dullness-none, rub- none           Chest wall-  Abd- tender-no, distended-no, bowel sounds-present, HSM- no Br/ Gen/ Rectal- Not done, not indicated Extrem- cyanosis- none, clubbing, none, atrophy- none, strength- nl Neuro- grossly intact to observation

## 2011-08-10 NOTE — Patient Instructions (Signed)
Order- [facility]- change CPAP to fixed 10 cwp   Dx OSA

## 2011-08-13 NOTE — Assessment & Plan Note (Signed)
We will change her CPAP from auto titration to a fixed pressure of 10 after discussion, and see how she does.

## 2011-08-13 NOTE — Assessment & Plan Note (Signed)
Her semi-autonomous activity during sleep - REM behavior disorder. But if her sleep apnea has been causing partial arousals to invite confusion, then treating the sleep apnea should help. That is what seems to be happening.

## 2011-08-21 ENCOUNTER — Encounter: Payer: Self-pay | Admitting: Internal Medicine

## 2011-10-02 ENCOUNTER — Telehealth: Payer: Self-pay | Admitting: Internal Medicine

## 2011-10-02 NOTE — Telephone Encounter (Signed)
I spoke with pt and she states she went to the office in [place] and had her pressure changed so she is not sure what this is about. I called [facility] and spoke with Trula Ore and made her aware of this. She needed nothing further

## 2012-02-26 ENCOUNTER — Telehealth: Payer: Self-pay | Admitting: Internal Medicine

## 2012-02-26 MED ORDER — CLONAZEPAM 0.5 MG PO TABS: ORAL_TABLET | ORAL | Status: DC

## 2012-02-26 NOTE — Telephone Encounter (Signed)
RX has been called into the pharmacy and pt aware--nothing further was needed

## 2012-02-26 NOTE — Telephone Encounter (Signed)
Per CY okay to refill.  

## 2012-02-26 NOTE — Telephone Encounter (Signed)
Pt is requesting refill on klonopin 0.5 mg. Last refilled 08/10/11 #60 x 5 refills. Last ov 08/10/11 f/u in 1 year. Please advise Dr. Maple Hudson thanks

## 2012-08-11 ENCOUNTER — Ambulatory Visit: Payer: 59 | Admitting: Internal Medicine

## 2012-08-19 ENCOUNTER — Ambulatory Visit: Payer: 59 | Admitting: Internal Medicine

## 2012-09-02 ENCOUNTER — Ambulatory Visit: Payer: 59 | Admitting: Internal Medicine

## 2012-10-20 ENCOUNTER — Ambulatory Visit: Payer: 59 | Admitting: Internal Medicine

## 2013-01-14 ENCOUNTER — Other Ambulatory Visit: Payer: Self-pay | Admitting: Internal Medicine

## 2013-01-14 NOTE — Telephone Encounter (Signed)
Please advise if okay to refill. Thanks.  

## 2013-01-14 NOTE — Telephone Encounter (Signed)
Ok to refill clonazepam 

## 2013-01-15 NOTE — Telephone Encounter (Signed)
Refill called to pharmacy.

## 2013-04-02 ENCOUNTER — Other Ambulatory Visit: Payer: Self-pay | Admitting: Internal Medicine

## 2013-04-02 NOTE — Telephone Encounter (Signed)
Please advise if okay to refill. Thanks.  

## 2013-04-02 NOTE — Telephone Encounter (Signed)
Ok to refill 

## 2013-08-02 ENCOUNTER — Other Ambulatory Visit: Payer: Self-pay | Admitting: Internal Medicine

## 2013-08-10 ENCOUNTER — Encounter: Payer: Self-pay | Admitting: Internal Medicine

## 2013-08-10 ENCOUNTER — Encounter (INDEPENDENT_AMBULATORY_CARE_PROVIDER_SITE_OTHER): Payer: Self-pay

## 2013-08-10 ENCOUNTER — Ambulatory Visit (INDEPENDENT_AMBULATORY_CARE_PROVIDER_SITE_OTHER): Payer: 59 | Admitting: Internal Medicine

## 2013-08-10 VITALS — BP 140/80 | HR 60 | Ht 68.0 in | Wt 194.0 lb

## 2013-08-10 DIAGNOSIS — G4752 REM sleep behavior disorder: Secondary | ICD-10-CM

## 2013-08-10 DIAGNOSIS — G4733 Obstructive sleep apnea (adult) (pediatric): Secondary | ICD-10-CM

## 2013-08-10 MED ORDER — LORAZEPAM 0.5 MG PO TABS: ORAL_TABLET | ORAL | Status: DC

## 2013-08-10 MED ORDER — CLONAZEPAM 0.5 MG PO TABS: ORAL_TABLET | ORAL | Status: DC

## 2013-08-10 NOTE — Patient Instructions (Signed)
Script refill clonazepam  Script to add lorazepam 1 or two at bedtime  Please call as needed

## 2013-08-10 NOTE — Progress Notes (Signed)
Patient ID: Christine Knapp, female    DOB: 08/18/1961, 52 y.o.   MRN: [id number]  HPI 02/22/11- 64 yoF followed for OSA, REM Behavior Disorder, complicated by hx of mitral regurgitation and palpitations.  Last here March 10, 2010- had changed jobs then and wasn't using CPAP.  She still sleeps alone, but disturbs the household by active screaming, sleepwalking through her nightmares. She remembers a lot of it the next day.  Previously tried Vivactil, Tofranil, doxepin without benefit. Stopped using clonazepam and alprazolam- not helpful. During day she is "used to feeling tired". No headaches since septoplasty 8 weeks ago.   08/10/11- 49 yoF followed for OSA, REM Behavior Disorder, complicated by hx of mitral regurgitation and palpitations.  She declines flu vaccine. Overall has been sleeping better. CPAP definitely helps. Autotitration showed good control at 10 CWP and she is using it all night every night. We discussed auto versus fixed pressure settings. An occasional episode when she takes the mask off in her sleep and goes sleepwalking but this is much less often compared with a 5/7 nights average before CPAP. Taking her propranolol in the morning. Clonazepam helps, taken 3 hours before bedtime. She still has some sleep latency but it is acceptable.  08/10/13- 52 yoF never smoker followed for OSA, REM Behavior Disorder, complicated by hx of mitral regurgitation and palpitations. FOLLOWS FOR: No longer using CPAP last 6 months.  Sleeping not doing well Sleepwalking got worse and she kept pulling the CPAP off so she quit using it. Pattern is always the same "always saving somebody". No history of seizure.She is still on propranolol and we discussed the effect of beta blocker on sleep. Marriage separation stress. Using clonazepam 0.5 mg. One tab is enough. Says alprazolam caused tachycardia palpitation one time.  Review of Systems Constitutional:   No-   weight loss, night sweats, fevers,  chills,+ fatigue, lassitude. HEENT:   No-  headaches, difficulty swallowing, tooth/dental problems, sore throat,       No-  sneezing, itching, ear ache, nasal congestion, post nasal drip,  CV:  No-   chest pain, orthopnea, PND, swelling in lower extremities, anasarca,  dizziness, palpitations Resp: No-   shortness of breath with exertion or at rest.              No-   productive cough,  No non-productive cough,  No- coughing up of blood.              No-   change in color of mucus.  No- wheezing.   Skin: No-   rash or lesions. GI:  No-   heartburn, indigestion, abdominal pain, nausea, vomiting,  GU: MS:  No-   joint pain or swelling.  . Neuro-     nothing unusual Psych:  No- change in mood or affect. No depression + anxiety.  No memory loss.     Objective:   Physical Exam General- Alert, Oriented, Affect-appropriate, Distress- none acute, overweight Skin- rash-none, lesions- none, excoriation- none Lymphadenopathy- none Head- atraumatic            Eyes- Gross vision intact, PERRLA, conjunctivae clear secretions            Ears- Hearing, canals-normal            Nose- Clear, no-Septal dev, mucus, polyps, erosion, perforation             Throat- Mallampati III-IV , mucosa clear , drainage- none, tonsils- atrophic Neck- flexible , trachea midline,  no stridor , thyroid nl, carotid no bruit Chest - symmetrical excursion , unlabored           Heart/CV- RRR , no murmur , no gallop  , no rub, nl s1 s2                           - JVD- none , edema- none, stasis changes- none, varices- none           Lung- clear to P&A, wheeze- none, cough- none , dullness-none, rub- none           Chest wall-  Abd-  Br/ Gen/ Rectal- Not done, not indicated Extrem- cyanosis- none, clubbing, none, atrophy- none, strength- nl Neuro- grossly intact to observation

## 2013-08-27 NOTE — Assessment & Plan Note (Signed)
If we can calm her sleep, hopefully we can restart CPAP. Plan-continue clonazepam 0.5 mg, but add short half-life lorazepam

## 2013-08-27 NOTE — Assessment & Plan Note (Signed)
Pattern made worse by recent marital separation and associated stress. Plan-continue clonazepam, but and a shorter half-life medicine at bedtime so there will be much carryover. 1 mg clonazepam was too much.

## 2013-11-10 ENCOUNTER — Ambulatory Visit: Payer: 59 | Admitting: Internal Medicine

## 2013-11-23 ENCOUNTER — Encounter: Payer: Self-pay | Admitting: General Surgery

## 2013-12-03 ENCOUNTER — Encounter: Payer: Self-pay | Admitting: Cardiology

## 2014-01-11 ENCOUNTER — Ambulatory Visit: Payer: 59 | Admitting: Internal Medicine

## 2014-01-12 ENCOUNTER — Ambulatory Visit: Payer: 59 | Admitting: Cardiology

## 2014-03-10 ENCOUNTER — Other Ambulatory Visit: Payer: Self-pay | Admitting: Internal Medicine

## 2014-03-10 NOTE — Telephone Encounter (Signed)
Ok to refill 

## 2014-03-10 NOTE — Telephone Encounter (Signed)
CY, Please advise if okay to refill. Thanks.  

## 2014-03-15 ENCOUNTER — Ambulatory Visit (INDEPENDENT_AMBULATORY_CARE_PROVIDER_SITE_OTHER): Payer: 59 | Admitting: Cardiology

## 2014-03-15 ENCOUNTER — Telehealth: Payer: Self-pay | Admitting: Internal Medicine

## 2014-03-15 ENCOUNTER — Encounter: Payer: Self-pay | Admitting: Cardiology

## 2014-03-15 VITALS — BP 124/73 | HR 58 | Ht 68.0 in | Wt 195.4 lb

## 2014-03-15 DIAGNOSIS — R002 Palpitations: Secondary | ICD-10-CM

## 2014-03-15 MED ORDER — PROPRANOLOL HCL 60 MG PO TABS: 120.0000 mg | ORAL_TABLET | Freq: Every day | ORAL | Status: DC

## 2014-03-15 NOTE — Patient Instructions (Signed)
Your physician recommends that you continue on your current medications as directed. Please refer to the Current Medication list given to you today.  Your physician wants you to follow-up in: 1 YEAR OV You will receive a reminder letter in the mail two months in advance. If you don't receive a letter, please call our office to schedule the follow-up appointment.  

## 2014-03-15 NOTE — Telephone Encounter (Signed)
Misty StanleyLisa w/ [facility] is calling regarding Rx for clonazepam & can be reached at [phone].  Antionette FairyHolly D Pryor

## 2014-03-15 NOTE — Telephone Encounter (Signed)
Called the pharm back and okayed refill for 6 months and advised them to let pt know she will need appt for any future refills  Pt aware and was transferred to schedulers to make appt

## 2014-03-15 NOTE — Telephone Encounter (Signed)
[facility] PT line rang several times and no answer and no VM WCB 

## 2014-03-15 NOTE — Progress Notes (Signed)
[address] [place], [place]  [address] Phone: [phone] Fax:  [phone](336) [phone]  Date:  03/15/2014   ID:  Christine Knapp, DOB 08/01/1961, MRN [id number]  PCP:  Miguel AschoffOSS,ALLAN, MD   History of Present Illness: Christine Knapp is a 53 y.o. female here for the follow of palpitations. She's been battling with tachycardia recently and has a history of a murmur. at prior visit, her propranolol was increased to 120 mg once a day. She is doing very well on this regimen. Even fewer palpitations are noted. She underwent an event monitor which showed no evidence of any adverse arrhythmias. Rare PACs/PVCs were noted. Her average heart rate was 85 beats per minute. She also had an echocardiogram because of her prior murmur which showed only trace mitral regurgitation/tricuspid regurgitation. No need for dental prophylaxis. No syncope. No chest pain. Been on Bb for 10 years.  Atypical left-sided chest pain, short-lived usually under emotional stress. She's been doing better with her anxiety, on minimal medications   Wt Readings from Last 3 Encounters:  03/15/14 195 lb 6.4 oz (88.633 kg)  08/10/13 194 lb (87.998 kg)  08/10/11 213 lb 12.8 oz (96.979 kg)     Past Medical History  Diagnosis Date  . OSA (obstructive sleep apnea)     2003.  AHI 31,  2005 AHI3/hr-no therapy  . REM behavioral disorder     w/ question of night terrors  . Allergic rhinitis   . Palpitation   . Migraine   . Heart murmur   . Shingles     recurrent    Past Surgical History  Procedure Laterality Date  . Total abdominal hysterectomy    . Tonsillectomy      Current Outpatient Prescriptions  Medication Sig Dispense Refill  . clonazePAM (KLONOPIN) 0.5 MG tablet daily. 1 or 2 at bedtime as needed      . propranolol (INDERAL) 60 MG tablet Take 120 mg by mouth at bedtime.       . SUMAtriptan (IMITREX) 100 MG tablet Take 100 mg by mouth as needed for migraine or headache (1 tablet at onset of headache may  repeat in 2 hrs x1 not to exceed 2 tablets in 24 hours). May repeat in 2 hours if headache persists or recurs.      . valACYclovir (VALTREX) 1000 MG tablet Take 1,000 mg by mouth 3 (three) times daily as needed.       No current facility-administered medications for this visit.    Allergies:    Allergies  Allergen Reactions  . Aspirin Swelling  . Celebrex [Celecoxib] Swelling  . Nsaids Swelling  . Sulfonamide Derivatives   . Zolpidem Tartrate Other (See Comments)    Headaches    Social History:  The patient  reports that she has never smoked. She does not have any smokeless tobacco history on file. She reports that she does not drink alcohol or use illicit drugs.   Family History  Problem Relation Age of Onset  . Other Father     sleep waljer into his teens  . Other Mother     active dreamer    ROS:  Please see the history of present illness.   Atypical chest pain. No significant palpitations, no syncope, no bleeding   All other systems reviewed and negative.   PHYSICAL EXAM: VS:  BP 124/73  Pulse 58  Ht 5\' 8"  (1.727 m)  Wt 195 lb 6.4 oz (88.633 kg)  BMI 29.72 kg/m2 Well nourished, well developed, in no acute distress HEENT: normal, [place]/AT, EOMI Neck: no JVD, normal carotid upstroke, no bruit Cardiac:  normal S1, S2; RRR; no murmur Lungs:  clear to auscultation bilaterally, no wheezing, rhonchi or rales Abd: soft, nontender, no hepatomegaly, no bruits Ext: no edema, 2+ distal pulses Skin: warm and dry GU: deferred Neuro: no focal abnormalities noted, AAO x 3  EKG:  03/15/14-sinus bradycardia rate 58 no other abnormalities  ASSESSMENT AND PLAN:  1. Palpitations-overall doing well with propranolol. No changes made. She's been on this regimen for several years. 2. Atypical chest pain-likely noncardiac. Anxiety provoked. Continue to monitor. If symptoms worsen or become more worrisome, further cardiac testing may be warranted. 3. Anxiety-doing much better. He is not on  medications currently, side effects were worse than the anxiety.  Signed, Donato SchultzMark Evangelina Delancey, MD [facility]  03/15/2014 10:31 AM

## 2014-03-15 NOTE — Telephone Encounter (Signed)
Pt last had refill 08/10/13 #60 x 5 refills Last OV 08/10/13 told to f/u in 3 months Pt had cancelled last 2 appts and no pending appt. Please advise Dr. Maple HudsonYoung thanks

## 2014-03-15 NOTE — Telephone Encounter (Signed)
Ok yto refill for total 6 months. Please tell her we will need to see her for further refills after that time.

## 2014-06-03 ENCOUNTER — Other Ambulatory Visit: Payer: Self-pay | Admitting: Obstetrics and Gynecology

## 2014-06-04 LAB — CYTOLOGY - PAP

## 2014-08-02 ENCOUNTER — Telehealth: Payer: Self-pay | Admitting: Cardiology

## 2014-08-02 NOTE — Telephone Encounter (Signed)
Patient report increasing episodes of palpitations in the past week. Will have up to 10-12 daily. Pulse will go to 150 range. She occasionally gets sob Thinks the beta blocker is no longer working for her. Does she need an OV?

## 2014-08-02 NOTE — Telephone Encounter (Signed)
New message  Pt called reports the medication for her palpitations are not working. Please call for alternate medication//sr

## 2014-08-02 NOTE — Telephone Encounter (Signed)
Patient reports having

## 2014-08-03 NOTE — Telephone Encounter (Signed)
Follow Up        Pt returning phone call from nurse. Please call back.

## 2014-08-03 NOTE — Telephone Encounter (Signed)
appt scheduled for Friday at 1:45 pm  She states she feels some better today.  Pt will call back if problems prior to then.

## 2014-08-03 NOTE — Telephone Encounter (Signed)
Have her come in.  Elverna Caffee, MD  

## 2014-08-06 ENCOUNTER — Ambulatory Visit: Payer: 59 | Admitting: Cardiology

## 2014-09-06 ENCOUNTER — Encounter (INDEPENDENT_AMBULATORY_CARE_PROVIDER_SITE_OTHER): Payer: Self-pay

## 2014-09-06 ENCOUNTER — Ambulatory Visit: Payer: 59 | Admitting: Internal Medicine

## 2014-09-06 ENCOUNTER — Encounter: Payer: Self-pay | Admitting: Internal Medicine

## 2014-09-06 VITALS — BP 114/66 | HR 64 | Ht 68.0 in | Wt 185.4 lb

## 2014-09-06 DIAGNOSIS — G4733 Obstructive sleep apnea (adult) (pediatric): Secondary | ICD-10-CM

## 2014-09-06 MED ORDER — CLONAZEPAM 0.5 MG PO TABS
0.5000 mg | ORAL_TABLET | Freq: Every evening | ORAL | Status: DC | PRN
Start: 2014-09-06 — End: 2015-03-08

## 2014-09-06 NOTE — Patient Instructions (Signed)
Script refilled for clonazepam  For your stuffy nose, try otc Nasalcrom/ cromol/ cromolyn  Nasal spray- follow directions on the box.  Please call as needed

## 2014-09-06 NOTE — Progress Notes (Signed)
Patient ID: Christine Knapp, female    DOB: 07/06/1961, 53 y.o.   MRN: [id number]  HPI 02/22/11- [age] yoF followed for OSA, REM Behavior Disorder, complicated by hx of mitral regurgitation and palpitations.  Last here March 10, 2010- had changed jobs then and wasn't using CPAP.  She still sleeps alone, but disturbs the household by active screaming, sleepwalking through her nightmares. She remembers a lot of it the next day.  Previously tried Vivactil, Tofranil, doxepin without benefit. Stopped using clonazepam and alprazolam- not helpful. During day she is "used to feeling tired". No headaches since septoplasty 8 weeks ago.   08/10/11- 49 yoF followed for OSA, REM Behavior Disorder, complicated by hx of mitral regurgitation and palpitations.  She declines flu vaccine. Overall has been sleeping better. CPAP definitely helps. Autotitration showed good control at 10 CWP and she is using it all night every night. We discussed auto versus fixed pressure settings. An occasional episode when she takes the mask off in her sleep and goes sleepwalking but this is much less often compared with a 5/7 nights average before CPAP. Taking her propranolol in the morning. Clonazepam helps, taken 3 hours before bedtime. She still has some sleep latency but it is acceptable.  08/10/13- 52 yoF never smoker followed for OSA, REM Behavior Disorder, complicated by hx of mitral regurgitation and palpitations. FOLLOWS FOR: No longer using CPAP last 6 months.  Sleeping not doing well Sleepwalking got worse and she kept pulling the CPAP off so she quit using it. Pattern is always the same "always saving somebody". No history of seizure.She is still on propranolol and we discussed the effect of beta blocker on sleep. Marriage separation stress. Using clonazepam 0.5 mg. One tab is enough. Says alprazolam caused tachycardia palpitation one time.  09/06/14-  53 yoF never smoker followed for OSA, REM Behavior Disorder, complicated  by hx of mitral regurgitation and palpitations FOLLOWS FOR: No longer wearing CPAP-ends up taking it off in the middle of the night-sleep walks as well. Needs refill of clonazepam.   Review of Systems Constitutional:   No-   weight loss, night sweats, fevers, chills,+ fatigue, lassitude. HEENT:   No-  headaches, difficulty swallowing, tooth/dental problems, sore throat,       No-  sneezing, itching, ear ache, nasal congestion, post nasal drip,  CV:  No-   chest pain, orthopnea, PND, swelling in lower extremities, anasarca,  dizziness, palpitations Resp: No-   shortness of breath with exertion or at rest.              No-   productive cough,  No non-productive cough,  No- coughing up of blood.              No-   change in color of mucus.  No- wheezing.   Skin: No-   rash or lesions. GI:  No-   heartburn, indigestion, abdominal pain, nausea, vomiting,  GU: MS:  No-   joint pain or swelling.  . Neuro-     nothing unusual Psych:  No- change in mood or affect. No depression + anxiety.  No memory loss.     Objective:   Physical Exam General- Alert, Oriented, Affect-appropriate, Distress- none acute, overweight Skin- rash-none, lesions- none, excoriation- none Lymphadenopathy- none Head- atraumatic            Eyes- Gross vision intact, PERRLA, conjunctivae clear secretions            Ears- Hearing, canals-normal  Nose- Clear, no-Septal dev, mucus, polyps, erosion, perforation             Throat- Mallampati III-IV , mucosa clear , drainage- none, tonsils- atrophic Neck- flexible , trachea midline, no stridor , thyroid nl, carotid no bruit Chest - symmetrical excursion , unlabored           Heart/CV- RRR , no murmur , no gallop  , no rub, nl s1 s2                           - JVD- none , edema- none, stasis changes- none, varices- none           Lung- clear to P&A, wheeze- none, cough- none , dullness-none, rub- none           Chest wall-  Abd-  Br/ Gen/ Rectal- Not done, not  indicated Extrem- cyanosis- none, clubbing, none, atrophy- none, strength- nl Neuro- grossly intact to observation

## 2014-09-07 ENCOUNTER — Encounter: Payer: Self-pay | Admitting: Cardiology

## 2014-09-07 ENCOUNTER — Ambulatory Visit (INDEPENDENT_AMBULATORY_CARE_PROVIDER_SITE_OTHER): Payer: 59 | Admitting: Cardiology

## 2014-09-07 VITALS — BP 122/88 | HR 56 | Ht 68.0 in | Wt 186.0 lb

## 2014-09-07 DIAGNOSIS — R002 Palpitations: Secondary | ICD-10-CM

## 2014-09-07 DIAGNOSIS — I080 Rheumatic disorders of both mitral and aortic valves: Secondary | ICD-10-CM

## 2014-09-07 NOTE — Patient Instructions (Signed)
The current medical regimen is effective;  continue present plan and medications. May take extra Propranolol as needed for racing heart beat.  Follow up in 6 months with Dr. Anne FuSkains.  You will receive a letter in the mail 2 months before you are due.  Please call us when you receive this letter to schedule your follow up appointment.

## 2014-09-07 NOTE — Progress Notes (Signed)
[address] [place], [place]  [address] Phone: [phone]([phone] Fax:  [phone]([phone]  Date:  09/07/2014   ID:  Christine Knapp, DOB 08/05/1961, MRN [id number]  PCP:  Miguel AschoffOSS,ALLAN, MD   History of Present Illness: Christine Knapp is a 53 y.o. female here for the follow of palpitations. Occasionally, she will feel her heart rate increasing. Propranolol has been working quite well for several years. She's been battling with tachycardia recently.   At prior visit, her propranolol was increased to 120 mg once a day. She was doing very well on this regimen until recently when she's been having palpitations 10-12 times a day with pulse up to 150 range with occasionally shortness of breath.   She underwent an event monitor previously which showed no evidence of any adverse arrhythmias. Rare PACs/PVCs were noted. Her average heart rate was 85 beats per minute. She also had an echocardiogram because of her prior murmur which showed only trace mitral regurgitation/tricuspid regurgitation. No need for dental prophylaxis. No syncope. No chest pain. Been on Bb for 10 years.  Atypical left-sided chest pain, short-lived usually under emotional stress. She's been doing better with her anxiety, on minimal medications.  She also interestingly has left hand that is occasionally colder than her right hand. Her pulses are normal. No bruits. No evidence of subclavian stenosis on exam. No other focal abnormalities.   Wt Readings from Last 3 Encounters:  09/07/14 186 lb (84.369 kg)  09/06/14 185 lb 6.4 oz (84.097 kg)  03/15/14 195 lb 6.4 oz (88.633 kg)     Past Medical History  Diagnosis Date  . OSA (obstructive sleep apnea)     2003.  AHI 31,  2005 AHI3/hr-no therapy  . REM behavioral disorder     w/ question of night terrors  . Allergic rhinitis   . Palpitation   . Migraine   . Heart murmur   . Shingles     recurrent    Past Surgical History  Procedure Laterality Date  . Total  abdominal hysterectomy    . Tonsillectomy      Current Outpatient Prescriptions  Medication Sig Dispense Refill  . clonazePAM (KLONOPIN) 0.5 MG tablet Take 1-2 tablets (0.5-1 mg total) by mouth at bedtime as needed. 60 tablet 5  . propranolol (INDERAL) 60 MG tablet Take 2 tablets (120 mg total) by mouth at bedtime. (Patient taking differently: Take 120 mg by mouth daily. ) 180 tablet 3  . SUMAtriptan (IMITREX) 100 MG tablet Take 100 mg by mouth as needed for migraine or headache (1 tablet at onset of headache may repeat in 2 hrs x1 not to exceed 2 tablets in 24 hours). May repeat in 2 hours if headache persists or recurs.    . valACYclovir (VALTREX) 1000 MG tablet Take 1,000 mg by mouth 3 (three) times daily as needed.    . Vitamin D, Ergocalciferol, (DRISDOL) 50000 UNITS CAPS capsule Take 1 capsule by mouth once a week.  5   No current facility-administered medications for this visit.    Allergies:    Allergies  Allergen Reactions  . Aspirin Swelling  . Celebrex [Celecoxib] Swelling  . Nsaids Swelling  . Sulfonamide Derivatives   . Zolpidem Tartrate Other (See Comments)    Headaches    Social History:  The patient  reports that she has never smoked. She does not have any smokeless tobacco history on file. She reports that she does not drink alcohol or  use illicit drugs.   Family History  Problem Relation Age of Onset  . Other Father     sleep waljer into his teens  . Other Mother     active dreamer    ROS:  Please see the history of present illness.   Atypical chest pain. No significant palpitations, no syncope, no bleeding   All other systems reviewed and negative.   PHYSICAL EXAM: VS:  BP 122/88 mmHg  Pulse 56  Ht 5\' 8"  (1.727 m)  Wt 186 lb (84.369 kg)  BMI 28.29 kg/m2 Well nourished, well developed, in no acute distress HEENT: normal, [place]/AT, EOMI Neck: no JVD, normal carotid upstroke, no bruit Cardiac:  normal S1, S2; RRR; no murmur Lungs:  clear to auscultation  bilaterally, no wheezing, rhonchi or rales Abd: soft, nontender, no hepatomegaly, no bruits Ext: no edema, 2+ distal pulses Skin: warm and dry GU: deferred Neuro: no focal abnormalities noted, AAO x 3  EKG:  09/07/14-sinus bradycardia rate 56 with no other abnormalities 03/15/14-sinus bradycardia rate 58 no other abnormalities  ASSESSMENT AND PLAN:  1. Palpitations-previously doing well with propranolol. However recently, she has been having episodes with pulses into the 150 range up to attend 12 times a day. She will occasionally get shortness of breath with this. She thinks that the beta blocker is no longer working.  She's been on this regimen for several years. I will encourage her to take an extra propranolol if she is having racing/palpitations. 2. Atypical chest pain-likely noncardiac. Anxiety provoked. Continue to monitor. If symptoms worsen or become more worrisome, further cardiac testing may be warranted. 3. Anxiety-doing much better. He is not on medications currently, side effects were worse than the anxiety. 4. Mild mitral regurgitation-stable. Difficult to appreciate murmur currently. 5. Unilateral cold hand-could this be sympathetic nervous system driven?. Encourage neurologic evaluation if symptoms worsen. No evidence of decreased pulses to that hand. 6. Six-month follow-up.  Signed, Donato SchultzMark Admire Bunnell, MD [facility]  09/07/2014 9:48 AM

## 2014-09-22 ENCOUNTER — Encounter: Payer: Self-pay | Admitting: Podiatry

## 2014-09-22 ENCOUNTER — Ambulatory Visit (INDEPENDENT_AMBULATORY_CARE_PROVIDER_SITE_OTHER): Payer: 59 | Admitting: Podiatry

## 2014-09-22 ENCOUNTER — Ambulatory Visit (INDEPENDENT_AMBULATORY_CARE_PROVIDER_SITE_OTHER): Payer: 59

## 2014-09-22 VITALS — BP 149/90 | HR 58 | Resp 16

## 2014-09-22 DIAGNOSIS — M722 Plantar fascial fibromatosis: Secondary | ICD-10-CM

## 2014-09-22 MED ORDER — TRIAMCINOLONE ACETONIDE 10 MG/ML IJ SUSP
10.0000 mg | Freq: Once | INTRAMUSCULAR | Status: AC
  Administered 2014-09-22: 10 mg

## 2014-09-22 NOTE — Progress Notes (Signed)
   Subjective:    Patient ID: Christine Knapp, female    DOB: 04/22/1961, 53 y.o.   MRN: [id number]  HPI Comments: "I have pain in the heel"  Patient c/o aching plantar heel right for about 3 months. AM pain. Walking makes worse. She has been taking Tylenol only. Can't take NSAIDS.  Foot Pain      Review of Systems  All other systems reviewed and are negative.      Objective:   Physical Exam        Assessment & Plan:

## 2014-09-22 NOTE — Patient Instructions (Signed)

## 2014-09-22 NOTE — Progress Notes (Signed)
Subjective:     Patient ID: Christine BeathMichele C Barker, female   DOB: 08/17/1961, 53 y.o.   MRN: [id number]  HPI patient presents with pain in the right heel that has made walking difficult and has been present for at least 3 months.   Review of Systems  All other systems reviewed and are negative.      Objective:   Physical Exam  Constitutional: She is oriented to person, place, and time.  Cardiovascular: Intact distal pulses.   Musculoskeletal: Normal range of motion.  Neurological: She is oriented to person, place, and time.  Skin: Skin is warm.  Nursing note and vitals reviewed.  neurovascular status intact with muscle strength adequate and range of motion subtalar and midtarsal joint noted. Patient has no equinus digits that are well-perfused and is well oriented 3 and is noted to have severe discomfort in the right plantar fascia at the insertion of the tendon into the calcaneus     Assessment:     Plantar fasciitis right with inflammation and fluid around the medial band with moderate depression of the arch probably part of the pathological process    Plan:     H&P and x-rays reviewed. Injected the right plantar fascia 3 mg Kenalog 5 mg Xylocaine Marcaine mixture and instructed on physical therapy. I did scan for custom orthotics to reduce stress against the feet and we will see her back when those are returned

## 2014-10-08 ENCOUNTER — Telehealth: Payer: Self-pay | Admitting: *Deleted

## 2014-10-08 NOTE — Telephone Encounter (Signed)
PATIENT CAME INTO OFFICE AND PICKED UP HER ORTHOTICS SHE HAS HAD SEVERAL PAIRS SO NO APPOINTMENT WAS NECESSARY

## 2015-03-07 ENCOUNTER — Other Ambulatory Visit: Payer: Self-pay | Admitting: Cardiology

## 2015-03-08 ENCOUNTER — Other Ambulatory Visit: Payer: Self-pay | Admitting: Internal Medicine

## 2015-03-09 NOTE — Telephone Encounter (Signed)
Allergies  Allergen Reactions  . Aspirin Swelling  . Celebrex [Celecoxib] Swelling  . Nsaids Swelling  . Sulfonamide Derivatives   . Zolpidem Tartrate Other (See Comments)    Headaches   Pt requesting refill on Clonazepam. Last OV 09-06-14 to f/u PRN. Last refill 09-06-14 Qty:60 w/5 refills. Please advise on refill.

## 2015-03-09 NOTE — Telephone Encounter (Signed)
Ok to refill but she needs to be seen once a year or so

## 2015-03-20 ENCOUNTER — Other Ambulatory Visit: Payer: Self-pay | Admitting: Cardiology

## 2015-03-21 ENCOUNTER — Other Ambulatory Visit: Payer: Self-pay

## 2015-03-21 MED ORDER — PROPRANOLOL HCL 60 MG PO TABS: ORAL_TABLET | ORAL | Status: DC

## 2015-04-06 ENCOUNTER — Other Ambulatory Visit: Payer: Self-pay | Admitting: Cardiology

## 2015-04-22 ENCOUNTER — Other Ambulatory Visit: Payer: Self-pay | Admitting: Cardiology

## 2015-08-01 ENCOUNTER — Other Ambulatory Visit: Payer: Self-pay | Admitting: Cardiology

## 2015-08-31 ENCOUNTER — Other Ambulatory Visit: Payer: Self-pay | Admitting: Internal Medicine

## 2015-08-31 NOTE — Telephone Encounter (Signed)
Electronic refill request pt. Last ov 09/06/2014 last refill sent on 03/10/2015 for 60 tabs with 3 refills no pending app. Pt. Was informed that she needed a yearly f/u and has yet to do so. Dr. Maple HudsonYoung please advise if okay to refill.

## 2015-08-31 NOTE — Telephone Encounter (Signed)
Ok 60 tabs with one refill. Make annual f/u appointment

## 2015-09-02 ENCOUNTER — Telehealth: Payer: Self-pay | Admitting: Internal Medicine

## 2015-09-02 MED ORDER — CLONAZEPAM 0.5 MG PO TABS: ORAL_TABLET | ORAL | Status: DC

## 2015-09-02 NOTE — Telephone Encounter (Signed)
Ok to refill if not just done

## 2015-09-02 NOTE — Telephone Encounter (Signed)
[facility] - pt aware. Nothing further needed.

## 2015-09-02 NOTE — Telephone Encounter (Signed)
Spoke with pt, requesting refill on Clonazepam 0.5mg .   Last refill 03/10/15 #60, 1-2 tabs qhs prn with 3 refills.  [facility] [place] [facility] [place]    Last seen: 09/06/2014 Next ov: Monday  CY please advise on refill.  Thanks!

## 2015-09-02 NOTE — Telephone Encounter (Signed)
Ok to refill 

## 2015-09-05 ENCOUNTER — Encounter: Payer: Self-pay | Admitting: Internal Medicine

## 2015-09-05 ENCOUNTER — Ambulatory Visit: Payer: 59 | Admitting: Internal Medicine

## 2015-09-05 VITALS — BP 124/82 | HR 64 | Ht 69.0 in | Wt 211.6 lb

## 2015-09-05 DIAGNOSIS — G4733 Obstructive sleep apnea (adult) (pediatric): Secondary | ICD-10-CM

## 2015-09-05 DIAGNOSIS — Z23 Encounter for immunization: Secondary | ICD-10-CM

## 2015-09-05 MED ORDER — CLONAZEPAM 0.5 MG PO TABS: ORAL_TABLET | ORAL | Status: DC

## 2015-09-05 NOTE — Patient Instructions (Signed)
Order- Advanced change CPAP to auto 8-15, replacement mask of choice, humidifier, supplies, AirView,  Dx OSA  Flu vax  Refill script for clonazepam

## 2015-09-05 NOTE — Progress Notes (Signed)
Patient ID: Christine Knapp, female    DOB: 1961/01/21, 54 y.o.   MRN: [id number]  HPI 02/22/11- 29 yoF followed for OSA, REM Behavior Disorder, complicated by hx of mitral regurgitation and palpitations.  Last here March 10, 2010- had changed jobs then and wasn't using CPAP.  She still sleeps alone, but disturbs the household by active screaming, sleepwalking through her nightmares. She remembers a lot of it the next day.  Previously tried Vivactil, Tofranil, doxepin without benefit. Stopped using clonazepam and alprazolam- not helpful. During day she is "used to feeling tired". No headaches since septoplasty 8 weeks ago.   08/10/11- 49 yoF followed for OSA, REM Behavior Disorder, complicated by hx of mitral regurgitation and palpitations.  She declines flu vaccine. Overall has been sleeping better. CPAP definitely helps. Autotitration showed good control at 10 CWP and she is using it all night every night. We discussed auto versus fixed pressure settings. An occasional episode when she takes the mask off in her sleep and goes sleepwalking but this is much less often compared with a 5/7 nights average before CPAP. Taking her propranolol in the morning. Clonazepam helps, taken 3 hours before bedtime. She still has some sleep latency but it is acceptable.  08/10/13- 52 yoF never smoker followed for OSA, REM Behavior Disorder, complicated by hx of mitral regurgitation and palpitations. FOLLOWS FOR: No longer using CPAP last 6 months.  Sleeping not doing well Sleepwalking got worse and she kept pulling the CPAP off so she quit using it. Pattern is always the same "always saving somebody". No history of seizure.She is still on propranolol and we discussed the effect of beta blocker on sleep. Marriage separation stress. Using clonazepam 0.5 mg. One tab is enough. Says alprazolam caused tachycardia palpitation one time.  09/06/14-  53 yoF never smoker followed for OSA, REM Behavior Disorder, complicated  by hx of mitral regurgitation and palpitations FOLLOWS FOR: No longer wearing CPAP-ends up taking it off in the middle of the night-sleep walks as well. Needs refill of clonazepam.  09/05/15- 54 yoF never smoker followed for OSA, REM Behavior Disorder, complicated by hx of mitral regurgitation and palpitations CPAP 10/Advanced FOLLOW FOR: OSA, nightmares, sleep walking.  patient tried to wean off the Klonipin, but was walking in her sleep about 4 times nightly..      Review of Systems Constitutional:   No-   weight loss, night sweats, fevers, chills,+ fatigue, lassitude. HEENT:   No-  headaches, difficulty swallowing, tooth/dental problems, sore throat,       No-  sneezing, itching, ear ache, nasal congestion, post nasal drip,  CV:  No-   chest pain, orthopnea, PND, swelling in lower extremities, anasarca,  dizziness, palpitations Resp: No-   shortness of breath with exertion or at rest.              No-   productive cough,  No non-productive cough,  No- coughing up of blood.              No-   change in color of mucus.  No- wheezing.   Skin: No-   rash or lesions. GI:  No-   heartburn, indigestion, abdominal pain, nausea, vomiting,  GU: MS:  No-   joint pain or swelling.  . Neuro-     nothing unusual Psych:  No- change in mood or affect. No depression + anxiety.  No memory loss.     Objective:   Physical Exam General- Alert, Oriented, Affect-appropriate,  Distress- none acute, overweight Skin- rash-none, lesions- none, excoriation- none Lymphadenopathy- none Head- atraumatic            Eyes- Gross vision intact, PERRLA, conjunctivae clear secretions            Ears- Hearing, canals-normal            Nose- Clear, no-Septal dev, mucus, polyps, erosion, perforation             Throat- Mallampati III-IV , mucosa clear , drainage- none, tonsils- atrophic Neck- flexible , trachea midline, no stridor , thyroid nl, carotid no bruit Chest - symmetrical excursion , unlabored            Heart/CV- RRR , no murmur , no gallop  , no rub, nl s1 s2                           - JVD- none , edema- none, stasis changes- none, varices- none           Lung- clear to P&A, wheeze- none, cough- none , dullness-none, rub- none           Chest wall-  Abd-  Br/ Gen/ Rectal- Not done, not indicated Extrem- cyanosis- none, clubbing, none, atrophy- none, strength- nl Neuro- grossly intact to observation

## 2015-11-26 ENCOUNTER — Other Ambulatory Visit: Payer: Self-pay | Admitting: Cardiology

## 2016-03-26 ENCOUNTER — Other Ambulatory Visit: Payer: Self-pay | Admitting: Internal Medicine

## 2016-03-26 NOTE — Telephone Encounter (Signed)
Ok to refill total 6 months 

## 2016-03-26 NOTE — Telephone Encounter (Signed)
Pt last had clonazepam refilled 09/05/15 #60 x 5 refills, Please advise Dr. Maple HudsonYoung thanks

## 2016-09-04 ENCOUNTER — Ambulatory Visit: Payer: 59 | Admitting: Internal Medicine

## 2016-09-05 ENCOUNTER — Ambulatory Visit: Payer: 59 | Admitting: Adult Health

## 2016-09-10 ENCOUNTER — Ambulatory Visit: Payer: 59 | Admitting: Adult Health

## 2016-09-26 DIAGNOSIS — G479 Sleep disorder, unspecified: Secondary | ICD-10-CM | POA: Diagnosis not present

## 2016-09-26 DIAGNOSIS — I10 Essential (primary) hypertension: Secondary | ICD-10-CM | POA: Diagnosis not present

## 2016-09-26 DIAGNOSIS — R05 Cough: Secondary | ICD-10-CM | POA: Diagnosis not present

## 2016-10-01 ENCOUNTER — Ambulatory Visit: Payer: 59 | Admitting: Adult Health

## 2016-11-06 DIAGNOSIS — R51 Headache: Secondary | ICD-10-CM | POA: Diagnosis not present

## 2016-12-03 DIAGNOSIS — G4733 Obstructive sleep apnea (adult) (pediatric): Secondary | ICD-10-CM | POA: Diagnosis not present

## 2016-12-24 DIAGNOSIS — G4733 Obstructive sleep apnea (adult) (pediatric): Secondary | ICD-10-CM | POA: Diagnosis not present

## 2017-01-16 DIAGNOSIS — G4733 Obstructive sleep apnea (adult) (pediatric): Secondary | ICD-10-CM | POA: Diagnosis not present

## 2017-02-11 DIAGNOSIS — N61 Mastitis without abscess: Secondary | ICD-10-CM | POA: Diagnosis not present

## 2017-02-13 ENCOUNTER — Inpatient Hospital Stay (HOSPITAL_COMMUNITY)
Admission: AD | Admit: 2017-02-13 | Discharge: 2017-02-13 | Disposition: A | Discharge status: Home / Self Care | Payer: 59 | Source: Ambulatory Visit | Attending: Obstetrics & Gynecology | Admitting: Obstetrics & Gynecology

## 2017-02-13 ENCOUNTER — Other Ambulatory Visit: Payer: Self-pay | Admitting: Obstetrics and Gynecology

## 2017-02-13 ENCOUNTER — Encounter (HOSPITAL_COMMUNITY): Payer: Self-pay | Admitting: *Deleted

## 2017-02-13 ENCOUNTER — Other Ambulatory Visit: Payer: 59

## 2017-02-13 DIAGNOSIS — Z79899 Other long term (current) drug therapy: Secondary | ICD-10-CM | POA: Insufficient documentation

## 2017-02-13 DIAGNOSIS — N61 Mastitis without abscess: Secondary | ICD-10-CM

## 2017-02-13 DIAGNOSIS — Z882 Allergy status to sulfonamides status: Secondary | ICD-10-CM | POA: Diagnosis not present

## 2017-02-13 DIAGNOSIS — N644 Mastodynia: Secondary | ICD-10-CM | POA: Diagnosis not present

## 2017-02-13 DIAGNOSIS — Z886 Allergy status to analgesic agent status: Secondary | ICD-10-CM | POA: Diagnosis not present

## 2017-02-13 DIAGNOSIS — G4733 Obstructive sleep apnea (adult) (pediatric): Secondary | ICD-10-CM | POA: Insufficient documentation

## 2017-02-13 DIAGNOSIS — N611 Abscess of the breast and nipple: Secondary | ICD-10-CM | POA: Insufficient documentation

## 2017-02-13 LAB — CBC WITH DIFFERENTIAL/PLATELET
Basophils Absolute: 0 10*3/uL (ref 0.0–0.1)
Basophils Relative: 0 %
Eosinophils Absolute: 0.3 10*3/uL (ref 0.0–0.7)
Eosinophils Relative: 2 %
HCT: 39.4 % (ref 36.0–46.0)
HEMOGLOBIN: 12.8 g/dL (ref 12.0–15.0)
LYMPHS ABS: 2.8 10*3/uL (ref 0.7–4.0)
Lymphocytes Relative: 24 %
MCH: 27.6 pg (ref 26.0–34.0)
MCHC: 32.5 g/dL (ref 30.0–36.0)
MCV: 85.1 fL (ref 78.0–100.0)
MONOS PCT: 5 %
Monocytes Absolute: 0.6 10*3/uL (ref 0.1–1.0)
NEUTROS PCT: 69 %
Neutro Abs: 8.2 10*3/uL — ABNORMAL HIGH (ref 1.7–7.7)
Platelets: 264 10*3/uL (ref 150–400)
RBC: 4.63 MIL/uL (ref 3.87–5.11)
RDW: 14.1 % (ref 11.5–15.5)
WBC: 11.9 10*3/uL — ABNORMAL HIGH (ref 4.0–10.5)

## 2017-02-13 MED ORDER — CEFTRIAXONE SODIUM 1 G IJ SOLR
500.0000 mg | Freq: Once | INTRAMUSCULAR | Status: AC
  Administered 2017-02-13: 500 mg via INTRAMUSCULAR
  Filled 2017-02-13: qty 10

## 2017-02-13 NOTE — Discharge Instructions (Signed)
Skin Abscess A skin abscess is an infected area on or under your skin that contains pus and other material. An abscess can happen almost anywhere on your body. Some abscesses break open (rupture) on their own. Most continue to get worse unless they are treated. The infection can spread deeper into the body and into your blood, which can make you feel sick. Treatment usually involves draining the abscess. Follow these instructions at home: Abscess Care   If you have an abscess that has not drained, place a warm, clean, wet washcloth over the abscess several times a day. Do this as told by your doctor.  Follow instructions from your doctor about how to take care of your abscess. Make sure you:  Cover the abscess with a bandage (dressing).  Change your bandage or gauze as told by your doctor.  Wash your hands with soap and water before you change the bandage or gauze. If you cannot use soap and water, use hand sanitizer.  Check your abscess every day for signs that the infection is getting worse. Check for:  More redness, swelling, or pain.  More fluid or blood.  Warmth.  More pus or a bad smell. Medicines    Take over-the-counter and prescription medicines only as told by your doctor.  If you were prescribed an antibiotic medicine, take it as told by your doctor. Do not stop taking the antibiotic even if you start to feel better. General instructions   To avoid spreading the infection:  Do not share personal care items, towels, or hot tubs with others.  Avoid making skin-to-skin contact with other people.  Keep all follow-up visits as told by your doctor. This is important. Contact a doctor if:  You have more redness, swelling, or pain around your abscess.  You have more fluid or blood coming from your abscess.  Your abscess feels warm when you touch it.  You have more pus or a bad smell coming from your abscess.  You have a fever.  Your muscles ache.  You have  chills.  You feel sick. Get help right away if:  You have very bad (severe) pain.  You see red streaks on your skin spreading away from the abscess. This information is not intended to replace advice given to you by your health care provider. Make sure you discuss any questions you have with your health care provider. Document Released: 02/27/2008 Document Revised: 05/06/2016 Document Reviewed: 07/20/2015 Elsevier Interactive Patient Education  2017 [employer] Inc[employer]   Cellulitis, Adult Cellulitis is a skin infection. The infected area is usually red and tender. This condition occurs most often in the arms and lower legs. The infection can travel to the muscles, blood, and underlying tissue and become serious. It is very important to get treated for this condition. What are the causes? Cellulitis is caused by bacteria. The bacteria enter through a break in the skin, such as a cut, burn, insect bite, open sore, or crack. What increases the risk? This condition is more likely to occur in people who:  Have a weak defense system (immune system).  Have open wounds on the skin such as cuts, burns, bites, and scrapes. Bacteria can enter the body through these open wounds.  Are older.  Have diabetes.  Have a type of long-lasting (chronic) liver disease (cirrhosis) or kidney disease.  Use IV drugs. What are the signs or symptoms? Symptoms of this condition include:  Redness, streaking, or spotting on the skin.  Swollen area of  the skin.  Tenderness or pain when an area of the skin is touched.  Warm skin.  Fever.  Chills.  Blisters. How is this diagnosed? This condition is diagnosed based on a medical history and physical exam. You may also have tests, including:  Blood tests.  Lab tests.  Imaging tests. How is this treated? Treatment for this condition may include:  Medicines, such as antibiotic medicines or antihistamines.  Supportive care, such as rest and  application of cold or warm cloths (cold or warm compresses) to the skin.  Hospital care, if the condition is severe. The infection usually gets better within 1-2 days of treatment. Follow these instructions at home:  Take over-the-counter and prescription medicines only as told by your health care provider.  If you were prescribed an antibiotic medicine, take it as told by your health care provider. Do not stop taking the antibiotic even if you start to feel better.  Drink enough fluid to keep your urine clear or pale yellow.  Do not touch or rub the infected area.  Raise (elevate) the infected area above the level of your heart while you are sitting or lying down.  Apply warm or cold compresses to the affected area as told by your health care provider.  Keep all follow-up visits as told by your health care provider. This is important. These visits let your health care provider make sure a more serious infection is not developing. Contact a health care provider if:  You have a fever.  Your symptoms do not improve within 1-2 days of starting treatment.  Your bone or joint underneath the infected area becomes painful after the skin has healed.  Your infection returns in the same area or another area.  You notice a swollen bump in the infected area.  You develop new symptoms.  You have a general ill feeling (malaise) with muscle aches and pains. Get help right away if:  Your symptoms get worse.  You feel very sleepy.  You develop vomiting or diarrhea that persists.  You notice red streaks coming from the infected area.  Your red area gets larger or turns dark in color. This information is not intended to replace advice given to you by your health care provider. Make sure you discuss any questions you have with your health care provider. Document Released: 06/20/2005 Document Revised: 01/19/2016 Document Reviewed: 07/20/2015 Elsevier Interactive Patient Education  2017  [employer]

## 2017-02-13 NOTE — MAU Note (Signed)
Pt here with breast pain. Was diagnosed with mastitis on Monday by Dr. Arelia SneddonMcComb and started on antibiotics. Breast looks worse today and still running if not on fever reducing meds. Last took Tylenol at 1500, temp is 98.4 now.

## 2017-02-13 NOTE — MAU Provider Note (Signed)
History     CSN: [id number]  Arrival date and time: 02/13/17 4098   First Provider Initiated Contact with Patient 02/13/17 2028      Chief Complaint  Patient presents with  . Breast Pain   HPI   Ms.Christine Knapp is a 56 y.o. female G2P2002 non pregnant here in MAU with right breast pain. The symptoms started on Sunday 5/20. Her bight breast felt full; by Monday she was in excruciating pain. She called her GYN office on Monday and saw Dr. Arelia Sneddon who told her she had mastitis. She was started on antibiotics Monday. She was told to call back if symptoms did not start to improve. She does feel like the symptoms have improved just slightly, however is still concerned with the amount of redness and pain she is experiencing.  She has been taking tylenol for the pain which is helping some. She has allergy to Aspirin/ ibuprofen.  Fever tmax 102, yesterday fever was 101.0  She denies pain medication at this time.   OB History    Gravida Para Term Preterm AB Living   2 2 2     2    SAB TAB Ectopic Multiple Live Births           2      Past Medical History:  Diagnosis Date  . Allergic rhinitis   . Heart murmur   . Migraine   . OSA (obstructive sleep apnea)    2003.  AHI 31,  2005 AHI3/hr-no therapy  . Palpitation   . REM behavioral disorder    w/ question of night terrors  . Shingles    recurrent    Past Surgical History:  Procedure Laterality Date  . TONSILLECTOMY    . TOTAL ABDOMINAL HYSTERECTOMY      Family History  Problem Relation Age of Onset  . Other Father        sleep waljer into his teens  . Other Mother        active dreamer    Social History  Substance Use Topics  . Smoking status: Never Smoker  . Smokeless tobacco: Never Used  . Alcohol use No    Allergies:  Allergies  Allergen Reactions  . Aspirin Swelling  . Celebrex [Celecoxib] Swelling  . Nsaids Swelling  . Sulfonamide Derivatives   . Zolpidem Tartrate Other (See Comments)    Headaches     Prescriptions Prior to Admission  Medication Sig Dispense Refill Last Dose  . clonazePAM (KLONOPIN) 0.5 MG tablet TAKE 1 TO 2 TABLETS AT BEDTIME AS NEEDED 60 tablet 5   . propranolol (INDERAL) 60 MG tablet Take 2 tablets (120 mg total) by mouth at bedtime. 60 tablet 0 Taking  . SUMAtriptan (IMITREX) 100 MG tablet Take 100 mg by mouth as needed for migraine or headache (1 tablet at onset of headache may repeat in 2 hrs x1 not to exceed 2 tablets in 24 hours). May repeat in 2 hours if headache persists or recurs.   Taking  . valACYclovir (VALTREX) 1000 MG tablet Take 1,000 mg by mouth 3 (three) times daily as needed.   Taking  . Vitamin D, Ergocalciferol, (DRISDOL) 50000 UNITS CAPS capsule Take 1 capsule by mouth once a week.  5 Taking   Results for orders placed or performed during the hospital encounter of 02/13/17 (from the past 48 hour(s))  CBC with Differential     Status: Abnormal   Collection Time: 02/13/17  8:55 PM  Result Value Ref Range  WBC 11.9 (H) 4.0 - 10.5 K/uL   RBC 4.63 3.87 - 5.11 MIL/uL   Hemoglobin 12.8 12.0 - 15.0 g/dL   HCT 16.139.4 09.636.0 - 04.546.0 %   MCV 85.1 78.0 - 100.0 fL   MCH 27.6 26.0 - 34.0 pg   MCHC 32.5 30.0 - 36.0 g/dL   RDW 40.914.1 81.111.5 - 91.415.5 %   Platelets 264 150 - 400 K/uL   Neutrophils Relative % 69 %   Neutro Abs 8.2 (H) 1.7 - 7.7 K/uL   Lymphocytes Relative 24 %   Lymphs Abs 2.8 0.7 - 4.0 K/uL   Monocytes Relative 5 %   Monocytes Absolute 0.6 0.1 - 1.0 K/uL   Eosinophils Relative 2 %   Eosinophils Absolute 0.3 0.0 - 0.7 K/uL   Basophils Relative 0 %   Basophils Absolute 0.0 0.0 - 0.1 K/uL   Review of Systems  Constitutional: Positive for fever.  Skin: Positive for color change.   Physical Exam   Blood pressure 136/74, pulse 79, temperature 98.4 F (36.9 C), temperature source Oral, resp. rate 20, height 5\' 9"  (1.753 m), weight 218 lb (98.9 kg), SpO2 100 %.  Physical Exam  Constitutional: She appears well-developed and well-nourished.   Non-toxic appearance. She does not have a sickly appearance. She does not appear ill. No distress.  Respiratory: Right breast exhibits mass, skin change and tenderness. Right breast exhibits no inverted nipple and no nipple discharge. Left breast exhibits no inverted nipple, no mass, no nipple discharge, no skin change and no tenderness. Breasts are asymmetrical.    >10 cm area of erythema, non indurated, tender with palpation.   Skin: She is not diaphoretic.   MAU Course  Procedures  none  MDM     Discussed patient with Dr. Michaell CowingGross, [facility]> Rocephin IM recommended prior to DC. [facility], recommend patient to keep this appointment.   Offered pain medication> patient declined  CBC with diff ordered. WBC 12k with mild shift> patient afebrile here.  Discussed patient with Dr. Langston MaskerMorris> ok with plan of care and close follow up tomorrow Am. Patient is agreeable with plan of care.  Rocephin 500 mg IM given prior to DC.   Assessment and Plan   A:  1. Abscess of right breast   2. Cellulitis of right breast     P:  Discharge home in stable condition Go to the breast center at 0745 tomorrow am 5/24 If symptoms worsen throughout the [facility] Continue Keflex Tylenol for pain; patient is allergic to Aspirin. Follow up with GYN as needed  Duane Lopeasch, Yecheskel Kurek I, NP 02/13/2017 9:52 PM

## 2017-02-14 ENCOUNTER — Ambulatory Visit
Admission: RE | Admit: 2017-02-14 | Discharge: 2017-02-14 | Disposition: A | Discharge status: Home / Self Care | Payer: 59 | Source: Ambulatory Visit | Attending: Obstetrics and Gynecology | Admitting: Obstetrics and Gynecology

## 2017-02-14 ENCOUNTER — Other Ambulatory Visit: Payer: Self-pay | Admitting: Obstetrics and Gynecology

## 2017-02-14 DIAGNOSIS — R928 Other abnormal and inconclusive findings on diagnostic imaging of breast: Secondary | ICD-10-CM | POA: Diagnosis not present

## 2017-02-14 DIAGNOSIS — N61 Mastitis without abscess: Secondary | ICD-10-CM

## 2017-02-15 DIAGNOSIS — N61 Mastitis without abscess: Secondary | ICD-10-CM | POA: Diagnosis not present

## 2017-02-15 DIAGNOSIS — G4733 Obstructive sleep apnea (adult) (pediatric): Secondary | ICD-10-CM | POA: Diagnosis not present

## 2017-02-27 ENCOUNTER — Ambulatory Visit
Admission: RE | Admit: 2017-02-27 | Discharge: 2017-02-27 | Disposition: A | Discharge status: Home / Self Care | Payer: 59 | Source: Ambulatory Visit | Attending: Obstetrics and Gynecology | Admitting: Obstetrics and Gynecology

## 2017-02-27 ENCOUNTER — Other Ambulatory Visit: Payer: Self-pay | Admitting: Obstetrics and Gynecology

## 2017-02-27 DIAGNOSIS — N6489 Other specified disorders of breast: Secondary | ICD-10-CM | POA: Diagnosis not present

## 2017-02-27 DIAGNOSIS — N61 Mastitis without abscess: Secondary | ICD-10-CM

## 2017-03-05 DIAGNOSIS — Z01419 Encounter for gynecological examination (general) (routine) without abnormal findings: Secondary | ICD-10-CM | POA: Diagnosis not present

## 2017-03-18 DIAGNOSIS — G4733 Obstructive sleep apnea (adult) (pediatric): Secondary | ICD-10-CM | POA: Diagnosis not present

## 2017-03-28 DIAGNOSIS — G4733 Obstructive sleep apnea (adult) (pediatric): Secondary | ICD-10-CM | POA: Diagnosis not present

## 2017-04-17 DIAGNOSIS — G4733 Obstructive sleep apnea (adult) (pediatric): Secondary | ICD-10-CM | POA: Diagnosis not present

## 2017-05-18 DIAGNOSIS — G4733 Obstructive sleep apnea (adult) (pediatric): Secondary | ICD-10-CM | POA: Diagnosis not present

## 2017-06-06 ENCOUNTER — Inpatient Hospital Stay
Admission: RE | Admit: 2017-06-06 | Discharge: 2017-06-06 | Disposition: A | Discharge status: Home / Self Care | Payer: 59 | Source: Ambulatory Visit | Attending: Obstetrics and Gynecology | Admitting: Obstetrics and Gynecology

## 2017-06-18 DIAGNOSIS — G4733 Obstructive sleep apnea (adult) (pediatric): Secondary | ICD-10-CM | POA: Diagnosis not present

## 2017-07-05 DIAGNOSIS — J329 Chronic sinusitis, unspecified: Secondary | ICD-10-CM | POA: Diagnosis not present

## 2017-07-05 DIAGNOSIS — I10 Essential (primary) hypertension: Secondary | ICD-10-CM | POA: Diagnosis not present

## 2017-07-05 DIAGNOSIS — R51 Headache: Secondary | ICD-10-CM | POA: Diagnosis not present

## 2017-07-18 DIAGNOSIS — G4733 Obstructive sleep apnea (adult) (pediatric): Secondary | ICD-10-CM | POA: Diagnosis not present

## 2017-07-26 DIAGNOSIS — S134XXA Sprain of ligaments of cervical spine, initial encounter: Secondary | ICD-10-CM | POA: Diagnosis not present

## 2017-07-26 DIAGNOSIS — M546 Pain in thoracic spine: Secondary | ICD-10-CM | POA: Diagnosis not present

## 2017-08-18 DIAGNOSIS — G4733 Obstructive sleep apnea (adult) (pediatric): Secondary | ICD-10-CM | POA: Diagnosis not present

## 2017-09-17 DIAGNOSIS — G4733 Obstructive sleep apnea (adult) (pediatric): Secondary | ICD-10-CM | POA: Diagnosis not present

## 2017-09-30 DIAGNOSIS — G4733 Obstructive sleep apnea (adult) (pediatric): Secondary | ICD-10-CM | POA: Diagnosis not present

## 2017-10-18 DIAGNOSIS — G4733 Obstructive sleep apnea (adult) (pediatric): Secondary | ICD-10-CM | POA: Diagnosis not present

## 2017-11-18 DIAGNOSIS — G4733 Obstructive sleep apnea (adult) (pediatric): Secondary | ICD-10-CM | POA: Diagnosis not present

## 2017-12-03 DIAGNOSIS — J0100 Acute maxillary sinusitis, unspecified: Secondary | ICD-10-CM | POA: Diagnosis not present

## 2018-01-20 DIAGNOSIS — G4733 Obstructive sleep apnea (adult) (pediatric): Secondary | ICD-10-CM | POA: Diagnosis not present

## 2018-02-17 DIAGNOSIS — R5383 Other fatigue: Secondary | ICD-10-CM | POA: Diagnosis not present

## 2018-02-17 DIAGNOSIS — E7800 Pure hypercholesterolemia, unspecified: Secondary | ICD-10-CM | POA: Diagnosis not present

## 2018-02-17 DIAGNOSIS — Z79899 Other long term (current) drug therapy: Secondary | ICD-10-CM | POA: Diagnosis not present

## 2018-02-17 DIAGNOSIS — R0602 Shortness of breath: Secondary | ICD-10-CM | POA: Diagnosis not present

## 2018-03-15 DIAGNOSIS — Z23 Encounter for immunization: Secondary | ICD-10-CM | POA: Diagnosis not present

## 2018-04-17 DIAGNOSIS — L237 Allergic contact dermatitis due to plants, except food: Secondary | ICD-10-CM | POA: Diagnosis not present

## 2018-08-01 ENCOUNTER — Ambulatory Visit: Payer: Self-pay | Admitting: Podiatry

## 2018-08-11 ENCOUNTER — Ambulatory Visit: Payer: Self-pay | Admitting: Podiatry

## 2018-08-13 DIAGNOSIS — J00 Acute nasopharyngitis [common cold]: Secondary | ICD-10-CM | POA: Diagnosis not present

## 2018-08-13 DIAGNOSIS — R52 Pain, unspecified: Secondary | ICD-10-CM | POA: Diagnosis not present

## 2018-08-26 DIAGNOSIS — R002 Palpitations: Secondary | ICD-10-CM | POA: Diagnosis not present

## 2018-08-26 DIAGNOSIS — I10 Essential (primary) hypertension: Secondary | ICD-10-CM | POA: Diagnosis not present

## 2018-08-26 DIAGNOSIS — Z1159 Encounter for screening for other viral diseases: Secondary | ICD-10-CM | POA: Diagnosis not present

## 2018-08-26 DIAGNOSIS — Z0000 Encounter for general adult medical examination without abnormal findings: Secondary | ICD-10-CM | POA: Diagnosis not present

## 2018-10-24 ENCOUNTER — Other Ambulatory Visit: Payer: Self-pay | Admitting: Obstetrics and Gynecology

## 2018-10-24 DIAGNOSIS — N6310 Unspecified lump in the right breast, unspecified quadrant: Secondary | ICD-10-CM

## 2018-11-12 ENCOUNTER — Ambulatory Visit
Admission: RE | Admit: 2018-11-12 | Discharge: 2018-11-12 | Disposition: A | Discharge status: Home / Self Care | Payer: 59 | Source: Ambulatory Visit | Attending: Obstetrics and Gynecology | Admitting: Obstetrics and Gynecology

## 2018-11-12 DIAGNOSIS — N6310 Unspecified lump in the right breast, unspecified quadrant: Secondary | ICD-10-CM

## 2018-11-12 DIAGNOSIS — N6489 Other specified disorders of breast: Secondary | ICD-10-CM | POA: Diagnosis not present

## 2018-11-12 DIAGNOSIS — R928 Other abnormal and inconclusive findings on diagnostic imaging of breast: Secondary | ICD-10-CM | POA: Diagnosis not present

## 2018-12-08 DIAGNOSIS — R6889 Other general symptoms and signs: Secondary | ICD-10-CM | POA: Diagnosis not present

## 2019-06-30 ENCOUNTER — Other Ambulatory Visit: Payer: Self-pay

## 2019-06-30 DIAGNOSIS — Z20822 Contact with and (suspected) exposure to covid-19: Secondary | ICD-10-CM

## 2019-07-02 LAB — NOVEL CORONAVIRUS, NAA: SARS-CoV-2, NAA: NOT DETECTED

## 2020-10-30 NOTE — Progress Notes (Signed)
Cardiology Office Note:    Date:  11/01/2020   ID:  Christine Knapp, DOB January 03, 1961, MRN [id number]  PCP:  Lawerance Cruel, MD  [place] Cardiologist:  Freada Bergeron, MD  [facility] Electrophysiologist:  None   Referring MD: Marda Stalker, PA-C    History of Present Illness:    Christine Knapp is a 60 y.o. female with a hx of migraines who was referred by Marda Stalker, PA-C for further evaluation of palpitations.  Patient states that she has been having palpitations for years. She feels like a fluttering in her chest and sometimes is feels like it "starts and stops." Can occur daily and lasts a couple of seconds. Sometimes has some shortness of breath associated with it. No known triggers. Has been taking propranolol which can help. She is under a lot stress and she works full time, mom is in a nursing home, father is 58 and lives alone, she has 2 kids and grand kids and she is the primary caregiver for everyone. No exertional chest pain or SOB. No orthopnea, LE edema, PND.   Also notes that she gets chest pressure when she is super anxious or when she thinks of her "to-do" list. Symptoms are not exertional, but have been increasing in nature.   Family history: very strong family history of CAD in both sets of grandparents, aunts, uncles.   Past Medical History:  Diagnosis Date  . Allergic rhinitis   . Heart murmur   . Migraine   . OSA (obstructive sleep apnea)    2003.  AHI 31,  2005 AHI3/hr-no therapy  . Palpitation   . REM behavioral disorder    w/ question of night terrors  . Shingles    recurrent    Past Surgical History:  Procedure Laterality Date  . TONSILLECTOMY    . TOTAL ABDOMINAL HYSTERECTOMY      Current Medications: Current Meds  Medication Sig  . clonazePAM (KLONOPIN) 0.5 MG tablet TAKE 1 TO 2 TABLETS AT BEDTIME AS NEEDED  . clonazePAM (KLONOPIN) 1 MG tablet Take 1 mg by mouth at bedtime.  . hydrOXYzine (VISTARIL) 25 MG capsule  Take 25 mg by mouth as needed.  Marland Kitchen ipratropium (ATROVENT) 0.06 % nasal spray Place 2 sprays into both nostrils 3 (three) times daily as needed.  . metoprolol tartrate (LOPRESSOR) 50 MG tablet Take one tablet by mouth 2 hours prior to CT Scan  . Multiple Vitamin (MULTI-VITAMIN) tablet Take 1 tablet by mouth daily.  . prazosin (MINIPRESS) 2 MG capsule Take 2 mg by mouth at bedtime.  . propranolol (INDERAL) 60 MG tablet Take 2 tablets (120 mg total) by mouth at bedtime.  . sertraline (ZOLOFT) 25 MG tablet Take 25 mg by mouth daily.  . SUMAtriptan (IMITREX) 50 MG tablet Take 1 tablet by mouth as needed.  . valACYclovir (VALTREX) 1000 MG tablet Take 1,000 mg by mouth 3 (three) times daily as needed.  . valsartan (DIOVAN) 80 MG tablet Take 1 tablet (80 mg total) by mouth daily.  . Vitamin D, Ergocalciferol, (DRISDOL) 50000 UNITS CAPS capsule Take 1 capsule by mouth once a week.  . [DISCONTINUED] SUMAtriptan (IMITREX) 100 MG tablet Take 100 mg by mouth as needed for migraine or headache (1 tablet at onset of headache may repeat in 2 hrs x1 not to exceed 2 tablets in 24 hours). May repeat in 2 hours if headache persists or recurs.     Allergies:   Aspirin, Celebrex [celecoxib], Nsaids, Sulfonamide  derivatives, and Zolpidem tartrate   Social History   Socioeconomic History  . Marital status: Married    Spouse name: Not on file  . Number of children: 2  . Years of education: Not on file  . Highest education level: Not on file  Occupational History  . Occupation: Art therapist: [employer]  Tobacco Use  . Smoking status: Never Smoker  . Smokeless tobacco: Never Used  Substance and Sexual Activity  . Alcohol use: No  . Drug use: No  . Sexual activity: Not on file  Other Topics Concern  . Not on file  Social History Narrative  . Not on file   Social Determinants of Health   Financial Resource Strain: Not on file  Food Insecurity: Not on file  Transportation Needs: Not on file   Physical Activity: Not on file  Stress: Not on file  Social Connections: Not on file     Family History: The patient's family history includes Other in her father and mother.  ROS:   Please see the history of present illness.    Review of Systems  Constitutional: Negative for chills and fever.  HENT: Negative for nosebleeds.   Eyes: Negative for blurred vision and redness.  Respiratory: Positive for shortness of breath.   Cardiovascular: Positive for palpitations. Negative for chest pain, orthopnea, claudication, leg swelling and PND.  Gastrointestinal: Negative for blood in stool, nausea and vomiting.  Genitourinary: Negative for hematuria.  Musculoskeletal: Negative for falls.  Neurological: Negative for dizziness and loss of consciousness.  Endo/Heme/Allergies: Negative for polydipsia.  Psychiatric/Behavioral: Negative for substance abuse.    EKGs/Labs/Other Studies Reviewed:    The following studies were reviewed today: TTE 02-07-2006: LEFT VENTRICLE:  - The left ventricle was mildly dilated.  - Overall left ventricular systolic function was at the lower     limits of normal.  - Cannot rule out hypokinesis of the inferoposterior wall.  - Left ventricular wall thickness was normal.  AORTIC VALVE:  - The aortic valve was mildly calcified.  AORTA:  - The aortic root was normal in size.  MITRAL VALVE:  Doppler interpretation(s):  - There was mild mitral valvular regurgitation.  LEFT ATRIUM:  - The left atrium was mildly dilated.  RIGHT VENTRICLE:  - Right ventricular size was normal.  - Right ventricular systolic function was normal.  - Right ventricular wall thickness was normal.  TRICUSPID VALVE:  Doppler interpretation(s):  - There was mild tricuspid valvular regurgitation.  RIGHT ATRIUM:  - Right atrial size was normal.  SYSTEMIC VEINS:  - The inferior vena cava was normal.  PERICARDIUM:  - There was no pericardial  effusion.  - The pericardium was normal in appearance.  ---------------------------------------------------------------  SUMMARY  - The left ventricle was mildly dilated. Overall left ventricular     systolic function was at the lower limits of normal. Cannot     rule out hypokinesis of the inferoposterior wall.  - The aortic valve was mildly calcified.  - There was mild mitral valvular regurgitation.  - The left atrium was mildly dilated.    EKG:  EKG is  ordered today.  The ekg ordered today demonstrates NSR with HR 66  Recent Labs: No results found for requested labs within last 8760 hours.  Recent Lipid Panel No results found for: CHOL, TRIG, HDL, CHOLHDL, VLDL, LDLCALC, LDLDIRECT    Physical Exam:    VS:  BP (!) 144/98   Pulse 69   Ht 5'  9" (1.753 m)   Wt 202 lb (91.6 kg)   SpO2 97%   BMI 29.83 kg/m     Wt Readings from Last 3 Encounters:  11/01/20 202 lb (91.6 kg)  02/13/17 218 lb (98.9 kg)  09/05/15 211 lb 9.6 oz (96 kg)     GEN:  Well nourished, well developed in no acute distress HEENT: Normal NECK: No JVD; No carotid bruits CARDIAC: RRR, no murmurs, rubs, gallops RESPIRATORY:  Clear to auscultation without rales, wheezing or rhonchi  ABDOMEN: Soft, non-tender, non-distended MUSCULOSKELETAL:  No edema; No deformity  SKIN: Warm and dry NEUROLOGIC:  Alert and oriented x 3 PSYCHIATRIC:  Normal affect   ASSESSMENT:    1. Palpitations   2. Precordial pain   3. Chest pain, unspecified type   4. Primary hypertension    PLAN:    In order of problems listed above:  #Palpitations: Patient with intermittent palpitations with associated shortness of breath that has been ongoing for several years. Occurs daily and lasts a couple of seconds before abating. No known triggers but she thinks stress may be contributing. No HF symptoms, lightheadedness or dizziness.  -Check 2 week zio -Check TTE -Continue propranolol for symptom  management  #Chest Pain: Patient with intermittent episodes of chest pressure usually occurring when she is anxious/stressed. Symptoms are not exertional but notably has very strong family history of CAD. Will check coronary CTA.  -Check coronary CTA  #HTN: Blood pressure has been elevated at home and is currently in 140s here. -Start valsartan $RemoveBeforeDE'80mg'LIMuGNixNIBwPlw$  daily -Repeat BMET next week -Monitor blood pressure at home with goal BP <120/80s   Medication Adjustments/Labs and Tests Ordered: Current medicines are reviewed at length with the patient today.  Concerns regarding medicines are outlined above.  Orders Placed This Encounter  Procedures  . CT CORONARY MORPH W/CTA COR W/SCORE W/CA W/CM &/OR WO/CM  . CT CORONARY FRACTIONAL FLOW RESERVE DATA PREP  . CT CORONARY FRACTIONAL FLOW RESERVE FLUID ANALYSIS  . Basic Metabolic Panel (BMET)  . LONG TERM MONITOR (3-14 DAYS)  . EKG 12-Lead  . ECHOCARDIOGRAM COMPLETE   Meds ordered this encounter  Medications  . valsartan (DIOVAN) 80 MG tablet    Sig: Take 1 tablet (80 mg total) by mouth daily.    Dispense:  90 tablet    Refill:  3  . metoprolol tartrate (LOPRESSOR) 50 MG tablet    Sig: Take one tablet by mouth 2 hours prior to CT Scan    Dispense:  1 tablet    Refill:  0    Patient Instructions  Medication Instructions:  Your physician has recommended you make the following change in your medication: Start Valsartan 80 mg by mouth daily  *If you need a refill on your cardiac medications before your next appointment, please call your pharmacy*   Lab Work: Your physician recommends that you return for lab work in: one week-BMP.  The lab is open 7:30-4:15  If you have labs (blood work) drawn today and your tests are completely normal, you will receive your results only by: Marland Kitchen MyChart Message (if you have MyChart) OR . A paper copy in the mail If you have any lab test that is abnormal or we need to change your treatment, we will call you to  review the results.   Testing/Procedures: Your physician has requested that you have an echocardiogram. Echocardiography is a painless test that uses sound waves to create images of your heart. It provides your doctor with information  about the size and shape of your heart and how well your heart's chambers and valves are working. This procedure takes approximately one hour. There are no restrictions for this procedure.  Your physician has requested that you have cardiac CT. Cardiac computed tomography (CT) is a painless test that uses an x-ray machine to take clear, detailed pictures of your heart. For further information please visit [email or web address].[email or web address]. Please follow instruction sheet as given.  Your physician has recommended that you wear a holter monitor. Holter monitors are medical devices that record the heart's electrical activity. Doctors most often use these monitors to diagnose arrhythmias. Arrhythmias are problems with the speed or rhythm of the heartbeat. The monitor is a small, portable device. You can wear one while you do your normal daily activities. This is usually used to diagnose what is causing palpitations/syncope (passing out).--2 week zio      Follow-Up: At [facility], you and your health needs are our priority.  As part of our continuing mission to provide you with exceptional heart care, we have created designated Provider Care Teams.  These Care Teams include your primary Cardiologist (physician) and Advanced Practice Providers (APPs -  Physician Assistants and Nurse Practitioners) who all work together to provide you with the care you need, when you need it.  We recommend signing up for the patient portal called "MyChart".  Sign up information is provided on this After Visit Summary.  MyChart is used to connect with patients for Virtual Visits (Telemedicine).  Patients are able to view lab/test results, encounter notes, upcoming appointments, etc.  Non-urgent messages  can be sent to your provider as well.   To learn more about what you can do with MyChart, go to [email or web address].    Your next appointment:   3 month(s)  The format for your next appointment:   In Person  Provider:   You may see Freada Bergeron, MD or one of the following Advanced Practice Providers on your designated Care Team:    Richardson Dopp, PA-C  Vin Leola, [place]    Other Instructions   [place] Monitor Instructions   Your physician has requested you wear your ZIO patch monitor__14_____days.   This is a single patch monitor.  [employer] supplies one patch monitor per enrollment.  Additional stickers are not available.   Please do not apply patch if you will be having a Nuclear Stress Test, Echocardiogram, Cardiac CT, MRI, or Chest Xray during the time frame you would be wearing the monitor. The patch cannot be worn during these tests.  You cannot remove and re-apply the ZIO XT patch monitor.   Your ZIO patch monitor will be sent USPS Priority mail from [facility] directly to your home address. The monitor may also be mailed to a PO BOX if home delivery is not available.   It may take 3-5 days to receive your monitor after you have been enrolled.   Once you have received you monitor, please review enclosed instructions.  Your monitor has already been registered assigning a specific monitor serial # to you.   Applying the monitor   Shave hair from upper left chest.   Hold abrader disc by orange tab.  Rub abrader in 40 strokes over left upper chest as indicated in your monitor instructions.   Clean area with 4 enclosed alcohol pads .  Use all pads to assure are is cleaned thoroughly.  Let dry.   Apply patch as indicated in monitor instructions.  Patch will be place under collarbone on left side of chest with arrow pointing upward.   Rub patch adhesive wings for 2 minutes.Remove white label marked "1".  Remove white label marked "2".  Rub patch  adhesive wings for 2 additional minutes.   While looking in a mirror, press and release button in center of patch.  A small green light will flash 3-4 times .  This will be your only indicator the monitor has been turned on.     Do not shower for the first 24 hours.  You may shower after the first 24 hours.   Press button if you feel a symptom. You will hear a small click.  Record Date, Time and Symptom in the Patient Log Book.   When you are ready to remove patch, follow instructions on last 2 pages of Patient Log Book.  Stick patch monitor onto last page of Patient Log Book.   Place Patient Log Book in [place] box.  Use locking tab on box and tape box closed securely.  The [employer] has [employer] on it.  Please place in mailbox as soon as possible.  Your physician should have your test results approximately 7 days after the monitor has been mailed back to [facility].   Call [place] at [phone] if you have questions regarding your ZIO XT patch monitor.  Call them immediately if you see an orange light blinking on your monitor.   If your monitor falls off in less than 4 days contact our Monitor department at [phone].  If your monitor becomes loose or falls off after 4 days call [employer] at [phone] for suggestions on securing your monitor.   Your cardiac CT will be scheduled at one of the below locations:   [facility] [address] [place], [place] [address] [phone]  [facility] [address] [place], [place] [address] [phone]  If scheduled at [facility], please arrive at the [facility] main entrance of [facility] 30 minutes prior to test start time. Proceed to the [facility] Radiology Department (first floor) to check-in and test prep.  If scheduled at [facility], please arrive 15 mins early for check-in and  test prep.  Please follow these instructions carefully (unless otherwise directed):  Hold all erectile dysfunction medications at least 3 days (72 hrs) prior to test.  On the Night Before the Test: . Be sure to Drink plenty of water. . Do not consume any caffeinated/decaffeinated beverages or chocolate 12 hours prior to your test. . Do not take any antihistamines 12 hours prior to your test.   On the Day of the Test: . Drink plenty of water. Do not drink any water within one hour of the test. . Do not eat any food 4 hours prior to the test. . You may take your regular medications prior to the test.  . Take metoprolol (Lopressor) two hours prior to test.  Do not take propanolol the night before test . HOLD Furosemide/Hydrochlorothiazide morning of the test. . FEMALES- please wear underwire-free bra if available       After the Test: . Drink plenty of water. . After receiving IV contrast, you may experience a mild flushed feeling. This is normal. . On occasion, you may experience a mild rash up to 24 hours after the test. This is not dangerous. If this occurs, you can take Benadryl  25 mg and increase your fluid intake. . If you experience trouble breathing, this can be serious. If it is severe call 911 IMMEDIATELY. If it is mild, please call our office. . If you take any of these medications: Glipizide/Metformin, Avandament, Glucavance, please do not take 48 hours after completing test unless otherwise instructed.   Once we have confirmed authorization from your insurance company, we will call you to set up a date and time for your test. Based on how quickly your insurance processes prior authorizations requests, please allow up to 4 weeks to be contacted for scheduling your Cardiac CT appointment. Be advised that routine Cardiac CT appointments could be scheduled as many as 8 weeks after your provider has ordered it.  For non-scheduling related questions, please contact the cardiac  imaging nurse navigator should you have any questions/concerns: Marchia Bond, Cardiac Imaging Nurse Navigator Burley Saver, Interim Cardiac Imaging Nurse [place] and Vascular Services Direct Office Dial: [phone]   For scheduling needs, including cancellations and rescheduling, please call [place], [phone].      Signed, Freada Bergeron, MD  11/01/2020 5:29 PM    [facility]

## 2020-11-01 ENCOUNTER — Other Ambulatory Visit: Payer: Self-pay

## 2020-11-01 ENCOUNTER — Encounter: Payer: Self-pay | Admitting: *Deleted

## 2020-11-01 ENCOUNTER — Ambulatory Visit (INDEPENDENT_AMBULATORY_CARE_PROVIDER_SITE_OTHER): Payer: 59

## 2020-11-01 ENCOUNTER — Encounter: Payer: Self-pay | Admitting: Cardiology

## 2020-11-01 ENCOUNTER — Ambulatory Visit (INDEPENDENT_AMBULATORY_CARE_PROVIDER_SITE_OTHER): Payer: 59 | Admitting: Cardiology

## 2020-11-01 VITALS — BP 144/98 | HR 69 | Ht 69.0 in | Wt 202.0 lb

## 2020-11-01 DIAGNOSIS — R002 Palpitations: Secondary | ICD-10-CM | POA: Diagnosis not present

## 2020-11-01 DIAGNOSIS — R072 Precordial pain: Secondary | ICD-10-CM

## 2020-11-01 DIAGNOSIS — R079 Chest pain, unspecified: Secondary | ICD-10-CM

## 2020-11-01 DIAGNOSIS — I10 Essential (primary) hypertension: Secondary | ICD-10-CM

## 2020-11-01 MED ORDER — VALSARTAN 80 MG PO TABS: 80.0000 mg | ORAL_TABLET | Freq: Every day | ORAL | 3 refills | Status: DC

## 2020-11-01 MED ORDER — METOPROLOL TARTRATE 50 MG PO TABS: ORAL_TABLET | ORAL | 0 refills | Status: DC

## 2020-11-01 NOTE — Patient Instructions (Addendum)
Medication Instructions:  Your physician has recommended you make the following change in your medication: Start Valsartan 80 mg by mouth daily  *If you need a refill on your cardiac medications before your next appointment, please call your pharmacy*   Lab Work: Your physician recommends that you return for lab work in: one week-BMP.  The lab is open 7:30-4:15  If you have labs (blood work) drawn today and your tests are completely normal, you will receive your results only by: Marland Kitchen MyChart Message (if you have MyChart) OR . A paper copy in the mail If you have any lab test that is abnormal or we need to change your treatment, we will call you to review the results.   Testing/Procedures: Your physician has requested that you have an echocardiogram. Echocardiography is a painless test that uses sound waves to create images of your heart. It provides your doctor with information about the size and shape of your heart and how well your heart's chambers and valves are working. This procedure takes approximately one hour. There are no restrictions for this procedure.  Your physician has requested that you have cardiac CT. Cardiac computed tomography (CT) is a painless test that uses an x-ray machine to take clear, detailed pictures of your heart. For further information please visit [email or web address]. Please follow instruction sheet as given.  Your physician has recommended that you wear a holter monitor. Holter monitors are medical devices that record the heart's electrical activity. Doctors most often use these monitors to diagnose arrhythmias. Arrhythmias are problems with the speed or rhythm of the heartbeat. The monitor is a small, portable device. You can wear one while you do your normal daily activities. This is usually used to diagnose what is causing palpitations/syncope (passing out).--2 week zio      Follow-Up: At [facility], you and your health needs are our priority.  As part  of our continuing mission to provide you with exceptional heart care, we have created designated Provider Care Teams.  These Care Teams include your primary Cardiologist (physician) and Advanced Practice Providers (APPs -  Physician Assistants and Nurse Practitioners) who all work together to provide you with the care you need, when you need it.  We recommend signing up for the patient portal called "MyChart".  Sign up information is provided on this After Visit Summary.  MyChart is used to connect with patients for Virtual Visits (Telemedicine).  Patients are able to view lab/test results, encounter notes, upcoming appointments, etc.  Non-urgent messages can be sent to your provider as well.   To learn more about what you can do with MyChart, go to [email or web address].    Your next appointment:   3 month(s)  The format for your next appointment:   In Person  Provider:   You may see Freada Bergeron, MD or one of the following Advanced Practice Providers on your designated Care Team:    Richardson Dopp, PA-C  Vin [place], [place]    Other Instructions   [place] Monitor Instructions   Your physician has requested you wear your ZIO patch monitor__14_____days.   This is a single patch monitor.  [employer] supplies one patch monitor per enrollment.  Additional stickers are not available.   Please do not apply patch if you will be having a Nuclear Stress Test, Echocardiogram, Cardiac CT, MRI, or Chest Xray during the time frame you would be wearing the monitor. The patch cannot be worn during these tests.  You cannot remove  and re-apply the ZIO XT patch monitor.   Your ZIO patch monitor will be sent USPS Priority mail from [facility] directly to your home address. The monitor may also be mailed to a PO BOX if home delivery is not available.   It may take 3-5 days to receive your monitor after you have been enrolled.   Once you have received you monitor, please review  enclosed instructions.  Your monitor has already been registered assigning a specific monitor serial # to you.   Applying the monitor   Shave hair from upper left chest.   Hold abrader disc by orange tab.  Rub abrader in 40 strokes over left upper chest as indicated in your monitor instructions.   Clean area with 4 enclosed alcohol pads .  Use all pads to assure are is cleaned thoroughly.  Let dry.   Apply patch as indicated in monitor instructions.  Patch will be place under collarbone on left side of chest with arrow pointing upward.   Rub patch adhesive wings for 2 minutes.Remove white label marked "1".  Remove white label marked "2".  Rub patch adhesive wings for 2 additional minutes.   While looking in a mirror, press and release button in center of patch.  A small green light will flash 3-4 times .  This will be your only indicator the monitor has been turned on.     Do not shower for the first 24 hours.  You may shower after the first 24 hours.   Press button if you feel a symptom. You will hear a small click.  Record Date, Time and Symptom in the Patient Log Book.   When you are ready to remove patch, follow instructions on last 2 pages of Patient Log Book.  Stick patch monitor onto last page of Patient Log Book.   Place Patient Log Book in [place] box.  Use locking tab on box and tape box closed securely.  The [employer] has [employer] on it.  Please place in mailbox as soon as possible.  Your physician should have your test results approximately 7 days after the monitor has been mailed back to [facility].   Call [place] at [phone] if you have questions regarding your ZIO XT patch monitor.  Call them immediately if you see an orange light blinking on your monitor.   If your monitor falls off in less than 4 days contact our Monitor department at [phone].  If your monitor becomes loose or falls off after 4 days call [employer] at  [phone] for suggestions on securing your monitor.   Your cardiac CT will be scheduled at one of the below locations:   [facility] [address] [place], [place] [address] [phone]  [place] [address] [place], [place] [address] [phone]  If scheduled at [facility], please arrive at the [facility] main entrance of [facility] 30 minutes prior to test start time. Proceed to the [facility] Radiology Department (first floor) to check-in and test prep.  If scheduled at [facility], please arrive 15 mins early for check-in and test prep.  Please follow these instructions carefully (unless otherwise directed):  Hold all erectile dysfunction medications at least 3 days (72 hrs) prior to test.  On the Night Before the Test: . Be sure to Drink plenty of water. . Do not consume any caffeinated/decaffeinated beverages or  chocolate 12 hours prior to your test. . Do not take any antihistamines 12 hours prior to your test.   On the Day of the Test: . Drink plenty of water. Do not drink any water within one hour of the test. . Do not eat any food 4 hours prior to the test. . You may take your regular medications prior to the test.  . Take metoprolol (Lopressor) two hours prior to test.  Do not take propanolol the night before test . HOLD Furosemide/Hydrochlorothiazide morning of the test. . FEMALES- please wear underwire-free bra if available       After the Test: . Drink plenty of water. . After receiving IV contrast, you may experience a mild flushed feeling. This is normal. . On occasion, you may experience a mild rash up to 24 hours after the test. This is not dangerous. If this occurs, you can take Benadryl 25 mg and increase your fluid intake. . If you experience trouble breathing, this can be serious. If it is severe call 911 IMMEDIATELY. If it is mild,  please call our office. . If you take any of these medications: Glipizide/Metformin, Avandament, Glucavance, please do not take 48 hours after completing test unless otherwise instructed.   Once we have confirmed authorization from your insurance company, we will call you to set up a date and time for your test. Based on how quickly your insurance processes prior authorizations requests, please allow up to 4 weeks to be contacted for scheduling your Cardiac CT appointment. Be advised that routine Cardiac CT appointments could be scheduled as many as 8 weeks after your provider has ordered it.  For non-scheduling related questions, please contact the cardiac imaging nurse navigator should you have any questions/concerns: Marchia Bond, Cardiac Imaging Nurse Navigator Burley Saver, Interim Cardiac Imaging Nurse [place] [facility] Direct Office Dial: [phone]   For scheduling needs, including cancellations and rescheduling, please call [place], [phone].

## 2020-11-01 NOTE — Progress Notes (Signed)
Patient ID: Christine Knapp, female   DOB: Nov 14, 1960, 60 y.o.   MRN: [id number] Patient enrolled for 14 day ZIO XT to be shipped to patients home.

## 2020-11-09 ENCOUNTER — Other Ambulatory Visit: Payer: 59

## 2020-11-11 ENCOUNTER — Other Ambulatory Visit: Payer: 59

## 2020-11-14 ENCOUNTER — Other Ambulatory Visit: Payer: 59 | Admitting: *Deleted

## 2020-11-14 ENCOUNTER — Other Ambulatory Visit: Payer: Self-pay

## 2020-11-14 DIAGNOSIS — R002 Palpitations: Secondary | ICD-10-CM

## 2020-11-14 DIAGNOSIS — R079 Chest pain, unspecified: Secondary | ICD-10-CM

## 2020-11-14 DIAGNOSIS — R072 Precordial pain: Secondary | ICD-10-CM

## 2020-11-15 LAB — BASIC METABOLIC PANEL
BUN/Creatinine Ratio: 19 (ref 9–23)
BUN: 15 mg/dL (ref 6–24)
CO2: 25 mmol/L (ref 20–29)
Calcium: 9.8 mg/dL (ref 8.7–10.2)
Chloride: 102 mmol/L (ref 96–106)
Creatinine, Ser: 0.81 mg/dL (ref 0.57–1.00)
GFR calc Af Amer: 93 mL/min/{1.73_m2} (ref >59)
GFR calc non Af Amer: 81 mL/min/{1.73_m2} (ref >59)
Glucose: 86 mg/dL (ref 65–99)
Potassium: 4.5 mmol/L (ref 3.5–5.2)
Sodium: 142 mmol/L (ref 134–144)

## 2020-11-28 ENCOUNTER — Other Ambulatory Visit: Payer: Self-pay

## 2020-11-28 ENCOUNTER — Ambulatory Visit (HOSPITAL_COMMUNITY): Payer: 59 | Attending: Cardiology

## 2020-11-28 DIAGNOSIS — R079 Chest pain, unspecified: Secondary | ICD-10-CM | POA: Insufficient documentation

## 2020-11-28 DIAGNOSIS — R072 Precordial pain: Secondary | ICD-10-CM | POA: Diagnosis not present

## 2020-11-28 LAB — ECHOCARDIOGRAM COMPLETE
Area-P 1/2: 2.39 cm2
S' Lateral: 3.1 cm

## 2021-01-02 ENCOUNTER — Telehealth (HOSPITAL_COMMUNITY): Payer: Self-pay | Admitting: Emergency Medicine

## 2021-01-02 NOTE — Telephone Encounter (Signed)
Reaching out to patient to offer assistance regarding upcoming cardiac imaging study; pt verbalizes understanding of appt date/time, parking situation and where to check in, pre-test NPO status and medications ordered, and verified current allergies; name and call back number provided for further questions should they arise Ernesteen Mihalic RN Navigator Cardiac Imaging [place] [phone] office [phone] cell 

## 2021-01-03 ENCOUNTER — Ambulatory Visit (HOSPITAL_COMMUNITY): Payer: 59

## 2021-01-12 ENCOUNTER — Telehealth (HOSPITAL_COMMUNITY): Payer: Self-pay | Admitting: *Deleted

## 2021-01-12 NOTE — Telephone Encounter (Signed)
Reaching out to patient to offer assistance regarding upcoming cardiac imaging study; pt verbalizes understanding of appt date/time, parking situation and where to check in, pre-test NPO status and medications ordered, and verified current allergies; name and call back number provided for further questions should they arise  Larey Brick RN Navigator Cardiac Imaging [facility] [phone] office [phone] cell  Appointment changed to 4/27 at 10am for [facility].  Pt to take 50mg  metoprolol tartrate 2 hours prior to scan.

## 2021-01-16 ENCOUNTER — Ambulatory Visit (HOSPITAL_COMMUNITY): Admission: RE | Admit: 2021-01-16 | Payer: 59 | Source: Ambulatory Visit

## 2021-01-16 ENCOUNTER — Encounter (HOSPITAL_COMMUNITY): Payer: Self-pay

## 2021-01-16 ENCOUNTER — Telehealth (HOSPITAL_COMMUNITY): Payer: Self-pay | Admitting: Emergency Medicine

## 2021-01-16 NOTE — Telephone Encounter (Signed)
Pt calling inquiring about her CCTA pre-auth status and if she can be rescheduled.   Rockwell Alexandria RN Navigator Cardiac Imaging [facility] [phone] Office  [phone] Cell

## 2021-01-18 ENCOUNTER — Inpatient Hospital Stay (HOSPITAL_BASED_OUTPATIENT_CLINIC_OR_DEPARTMENT_OTHER): Admission: RE | Admit: 2021-01-18 | Payer: 59 | Source: Ambulatory Visit

## 2021-01-29 NOTE — Progress Notes (Deleted)
Cardiology Office Note:    Date:  01/29/2021   ID:  Christine Knapp, DOB 05-24-1961, MRN [id number]  PCP:  [name], MD  [facility] Cardiologist:  Meriam Sprague, MD  [facility] Electrophysiologist:  None   Referring MD: [name], MD    History of Present Illness:    Christine Knapp is a 60 y.o. female with a hx of migraines who presents to clinic for follow-up.  Last seen in clinic on 11/01/20 for palpitations.  Cardiac monitor with 10 runs of nonsustained SVT and one run of nonsustained VT with longest lasting 12 beats at 128bpm. TTE with LVEF 55-60% with no significant valve disease. Coronary CTA pending.  Family history: very strong family history of CAD in both sets of grandparents, aunts, uncles.   Past Medical History:  Diagnosis Date  . Allergic rhinitis   . Heart murmur   . Migraine   . OSA (obstructive sleep apnea)    2003.  AHI 31,  2005 AHI3/hr-no therapy  . Palpitation   . REM behavioral disorder    w/ question of night terrors  . Shingles    recurrent    Past Surgical History:  Procedure Laterality Date  . TONSILLECTOMY    . TOTAL ABDOMINAL HYSTERECTOMY      Current Medications: No outpatient medications have been marked as taking for the 02/01/21 encounter (Appointment) with Meriam Sprague, MD.     Allergies:   Aspirin, Celebrex [celecoxib], Nsaids, Sulfonamide derivatives, and Zolpidem tartrate   Social History   Socioeconomic History  . Marital status: Married    Spouse name: Not on file  . Number of children: 2  . Years of education: Not on file  . Highest education level: Not on file  Occupational History  . Occupation: Art therapist: [employer]  Tobacco Use  . Smoking status: Never Smoker  . Smokeless tobacco: Never Used  Vaping Use  . Vaping Use: Never used  Substance and Sexual Activity  . Alcohol use: No  . Drug use: No  . Sexual activity: Not on file  Other Topics Concern  .  Not on file  Social History Narrative  . Not on file   Social Determinants of Health   Financial Resource Strain: Not on file  Food Insecurity: Not on file  Transportation Needs: Not on file  Physical Activity: Not on file  Stress: Not on file  Social Connections: Not on file     Family History: The patient's family history includes Other in her father and mother.  ROS:   Please see the history of present illness.    Review of Systems  Constitutional: Negative for chills and fever.  HENT: Negative for nosebleeds.   Eyes: Negative for blurred vision and redness.  Respiratory: Positive for shortness of breath.   Cardiovascular: Positive for palpitations. Negative for chest pain, orthopnea, claudication, leg swelling and PND.  Gastrointestinal: Negative for blood in stool, nausea and vomiting.  Genitourinary: Negative for hematuria.  Musculoskeletal: Negative for falls.  Neurological: Negative for dizziness and loss of consciousness.  Endo/Heme/Allergies: Negative for polydipsia.  Psychiatric/Behavioral: Negative for substance abuse.    EKGs/Labs/Other Studies Reviewed:    The following studies were reviewed today: Cardiac Monitor 11/24/20:  Patch wear time was 13 days and 4 hours  Predominant rhythm was normal sinus rhythm with average HR 76bpm; ranging from 52bpm to 162bpm/  There was one run of NVST lasting 12 beats at  rate 128bpm that correlated with patient triggered event.  There were 10 runs of non-sustained SVT with the longest lasting 8 beats with average HR 98bpm; fastest was 4 beats at 143bpm. One episode correlated with patient triggered event.  Rare SVes, rare PVCs.  Most patient triggered events corresponded to episodes of sinus tachycardia or SVEs.  Patient is scheduled for echo and coronary CTA for further work-up.  TTE 11/28/20: 1. Left ventricular ejection fraction, by estimation, is 55 to 60%. The  left ventricle has normal function. The left  ventricle has no regional  wall motion abnormalities. Left ventricular diastolic parameters are  consistent with Grade I diastolic  dysfunction (impaired relaxation).  2. Right ventricular systolic function is normal. The right ventricular  size is normal. There is normal pulmonary artery systolic pressure. The  estimated right ventricular systolic pressure is 20.0 mmHg.  3. The mitral valve is normal in structure. Trivial mitral valve  regurgitation. No evidence of mitral stenosis.  4. The aortic valve is tricuspid. Aortic valve regurgitation is not  visualized. No aortic stenosis is present.  5. The inferior vena cava is normal in size with greater than 50%  respiratory variability, suggesting right atrial pressure of 3 mmHg.   TTE 2007: LEFT VENTRICLE:  - The left ventricle was mildly dilated.  - Overall left ventricular systolic function was at the lower     limits of normal.  - Cannot rule out hypokinesis of the inferoposterior wall.  - Left ventricular wall thickness was normal.  AORTIC VALVE:  - The aortic valve was mildly calcified.  AORTA:  - The aortic root was normal in size.  MITRAL VALVE:  Doppler interpretation(s):  - There was mild mitral valvular regurgitation.  LEFT ATRIUM:  - The left atrium was mildly dilated.  RIGHT VENTRICLE:  - Right ventricular size was normal.  - Right ventricular systolic function was normal.  - Right ventricular wall thickness was normal.  TRICUSPID VALVE:  Doppler interpretation(s):  - There was mild tricuspid valvular regurgitation.  RIGHT ATRIUM:  - Right atrial size was normal.  SYSTEMIC VEINS:  - The inferior vena cava was normal.  PERICARDIUM:  - There was no pericardial effusion.  - The pericardium was normal in appearance.  ---------------------------------------------------------------  SUMMARY  - The left ventricle was mildly dilated. Overall left ventricular      systolic function was at the lower limits of normal. Cannot     rule out hypokinesis of the inferoposterior wall.  - The aortic valve was mildly calcified.  - There was mild mitral valvular regurgitation.  - The left atrium was mildly dilated.    EKG:  EKG is  ordered today.  The ekg ordered today demonstrates NSR with HR 66  Recent Labs: 11/14/2020: BUN 15; Creatinine, Ser 0.81; Potassium 4.5; Sodium 142  Recent Lipid Panel No results found for: CHOL, TRIG, HDL, CHOLHDL, VLDL, LDLCALC, LDLDIRECT    Physical Exam:    VS:  There were no vitals taken for this visit.    Wt Readings from Last 3 Encounters:  11/01/20 202 lb (91.6 kg)  02/13/17 218 lb (98.9 kg)  09/05/15 211 lb 9.6 oz (96 kg)     GEN:  Well nourished, well developed in no acute distress HEENT: Normal NECK: No JVD; No carotid bruits CARDIAC: RRR, no murmurs, rubs, gallops RESPIRATORY:  Clear to auscultation without rales, wheezing or rhonchi  ABDOMEN: Soft, non-tender, non-distended MUSCULOSKELETAL:  No edema; No deformity  SKIN: Warm and dry NEUROLOGIC:  Alert and oriented x 3 PSYCHIATRIC:  Normal affect   ASSESSMENT:    No diagnosis found. PLAN:    In order of problems listed above:  #Palpitations: Patient with intermittent palpitations with associated shortness of breath that has been ongoing for several years. Occurs daily and lasts a couple of seconds before abating. No known triggers but she thinks stress may be contributing. No HF symptoms, lightheadedness or dizziness. ZIo with 10 episodes of nonsustained SVT and one run on nonsustained VT. TTE with normal LVEF with no significant valve abnormalities.  -Continue propranolol for symptom management  #Chest Pain: Patient with intermittent episodes of chest pressure usually occurring when she is anxious/stressed. Symptoms are not exertional but notably has very strong family history of CAD. Will check coronary CTA.  -Check coronary  CTA  #HTN: Blood pressure has been elevated at home and is currently in 140s here. -Continue valsartan 80mg  daily -Monitor blood pressure at home with goal BP <120/80s   Medication Adjustments/Labs and Tests Ordered: Current medicines are reviewed at length with the patient today.  Concerns regarding medicines are outlined above.  No orders of the defined types were placed in this encounter.  No orders of the defined types were placed in this encounter.   There are no Patient Instructions on file for this visit.   Signed, , MD  01/29/2021 6:31 PM    [facility]

## 2021-02-01 ENCOUNTER — Ambulatory Visit: Payer: 59 | Admitting: Cardiology

## 2021-03-02 ENCOUNTER — Other Ambulatory Visit (HOSPITAL_COMMUNITY): Payer: Self-pay | Admitting: Emergency Medicine

## 2021-03-02 NOTE — Telephone Encounter (Signed)
Pt is scheduled for Cardiac CT for 6/29 at 1130.  Pt made aware of appt date and time by CT Scheduler [place].

## 2021-03-20 ENCOUNTER — Telehealth (HOSPITAL_COMMUNITY): Payer: Self-pay | Admitting: *Deleted

## 2021-03-20 NOTE — Telephone Encounter (Signed)
Reaching out to patient to offer assistance regarding upcoming cardiac imaging study; pt verbalizes understanding of appt date/time, parking situation and where to check in, pre-test NPO status and medications ordered, and verified current allergies; name and call back number provided for further questions should they arise  Vue Pavon RN Navigator Cardiac Imaging [place] [facility] [phone] office [phone] cell  Pt to take 50mg metoprolol tartrate 2 hours prior to cardiac CT. 

## 2021-03-22 ENCOUNTER — Ambulatory Visit (HOSPITAL_COMMUNITY)
Admission: RE | Admit: 2021-03-22 | Discharge: 2021-03-22 | Disposition: A | Discharge status: Home / Self Care | Payer: 59 | Source: Ambulatory Visit | Attending: Cardiology | Admitting: Cardiology

## 2021-03-22 ENCOUNTER — Other Ambulatory Visit: Payer: Self-pay

## 2021-03-22 DIAGNOSIS — R072 Precordial pain: Secondary | ICD-10-CM

## 2021-03-22 MED ORDER — NITROGLYCERIN 0.4 MG SL SUBL
0.8000 mg | SUBLINGUAL_TABLET | Freq: Once | SUBLINGUAL | Status: AC
Start: 2021-03-22 — End: 2021-03-22
  Administered 2021-03-22: 11:00:00 0.8 mg via SUBLINGUAL

## 2021-03-22 MED ORDER — IOHEXOL 350 MG/ML SOLN
95.0000 mL | Freq: Once | INTRAVENOUS | Status: AC | PRN
  Administered 2021-03-22: 12:00:00 95 mL via INTRAVENOUS

## 2021-03-22 MED ORDER — NITROGLYCERIN 0.4 MG SL SUBL
SUBLINGUAL_TABLET | SUBLINGUAL | Status: AC
  Filled 2021-03-22: qty 2

## 2021-04-21 NOTE — Progress Notes (Deleted)
Cardiology Office Note:    Date:  04/21/2021   ID:  Christine Knapp, DOB 06-17-1961, MRN [id number]  PCP:  Christine Floro, MD  [facility] Cardiologist:  Christine Sprague, MD  [facility] Electrophysiologist:  None   Referring MD: Christine Floro, MD    History of Present Illness:    Christine Knapp is a 60 y.o. female with a hx of migraines who was presents to clinic for follow-up of palpitations.  Patient was last seen in clinic on 11/01/20 with palpitations and intermittent chest pressure. Coronary CTA 03/22/21 with Ca score 0, no obstructive disease. Cardiac monitor 11/24/20 showed sinus brady-sinus tach. 10 episodes of nonsustained SVT. Rare SVE, PVCs. TTE Dec 03, 2020 with LVEF 55-60%, G1DD, no significant valve disease.   Today,   Family history: very strong family history of CAD in both sets of grandparents, aunts, uncles.   Past Medical History:  Diagnosis Date   Allergic rhinitis    Heart murmur    Migraine    OSA (obstructive sleep apnea)    2003.  AHI 31,  2005 AHI3/hr-no therapy   Palpitation    REM behavioral disorder    w/ question of night terrors   Shingles    recurrent    Past Surgical History:  Procedure Laterality Date   TONSILLECTOMY     TOTAL ABDOMINAL HYSTERECTOMY      Current Medications: No outpatient medications have been marked as taking for the 04/28/21 encounter (Appointment) with Christine Sprague, MD.     Allergies:   Aspirin, Celebrex [celecoxib], Nsaids, Sulfonamide derivatives, and Zolpidem tartrate   Social History   Socioeconomic History   Marital status: Married    Spouse name: Not on file   Number of children: 2   Years of education: Not on file   Highest education level: Not on file  Occupational History   Occupation: Psychologist, educational    Employer: [employer]  Tobacco Use   Smoking status: Never   Smokeless tobacco: Never  Vaping Use   Vaping Use: Never used  Substance and Sexual Activity   Alcohol  use: No   Drug use: No   Sexual activity: Not on file  Other Topics Concern   Not on file  Social History Narrative   Not on file   Social Determinants of Health   Financial Resource Strain: Not on file  Food Insecurity: Not on file  Transportation Needs: Not on file  Physical Activity: Not on file  Stress: Not on file  Social Connections: Not on file     Family History: The patient's family history includes Other in her father and mother.  ROS:   Please see the history of present illness.    Review of Systems  Constitutional:  Negative for chills and fever.  HENT:  Negative for nosebleeds.   Eyes:  Negative for blurred vision and redness.  Respiratory:  Positive for shortness of breath.   Cardiovascular:  Positive for palpitations. Negative for chest pain, orthopnea, claudication, leg swelling and PND.  Gastrointestinal:  Negative for blood in stool, nausea and vomiting.  Genitourinary:  Negative for hematuria.  Musculoskeletal:  Negative for falls.  Neurological:  Negative for dizziness and loss of consciousness.  Endo/Heme/Allergies:  Negative for polydipsia.  Psychiatric/Behavioral:  Negative for substance abuse.    EKGs/Labs/Other Studies Reviewed:    The following studies were reviewed today: TTE Dec 03, 2020: IMPRESSIONS   1. Left ventricular ejection fraction, by estimation, is 55 to 60%. The  left ventricle has normal function. The left ventricle has no regional  wall motion abnormalities. Left ventricular diastolic parameters are  consistent with Grade I diastolic  dysfunction (impaired relaxation).   2. Right ventricular systolic function is normal. The right ventricular  size is normal. There is normal pulmonary artery systolic pressure. The  estimated right ventricular systolic pressure is 20.0 mmHg.   3. The mitral valve is normal in structure. Trivial mitral valve  regurgitation. No evidence of mitral stenosis.   4. The aortic valve is tricuspid. Aortic  valve regurgitation is not  visualized. No aortic stenosis is present.   5. The inferior vena cava is normal in size with greater than 50%  respiratory variability, suggesting right atrial pressure of 3 mmHg.   Cardiac Monitor 11/24/20: Patch wear time was 13 days and 4 hours Predominant rhythm was normal sinus rhythm with average HR 76bpm; ranging from 52bpm to 162bpm/ There was one run of NVST lasting 12 beats at rate 128bpm that correlated with patient triggered event. There were 10 runs of non-sustained SVT with the longest lasting 8 beats with average HR 98bpm; fastest was 4 beats at 143bpm. One episode correlated with patient triggered event. Rare SVes, rare PVCs. Most patient triggered events corresponded to episodes of sinus tachycardia or SVEs. Patient is scheduled for echo and coronary CTA for further work-up.   Patch Wear Time:  13 days and 4 hours (2022-02-11T13:34:05-499 to 2022-02-24T17:55:01-499)   Patient had a min HR of 52 bpm, max HR of 162 bpm, and avg HR of 76 bpm. Predominant underlying rhythm was Sinus Rhythm. 1 run of Ventricular Tachycardia occurred lasting 12 beats with a max rate of 128 bpm (avg 111 bpm). 10 Supraventricular Tachycardia runs occurred, the run with the fastest interval lasting 4 beats with a max rate of 143 bpm, the longest lasting 8 beats with an avg rate of 97 bpm. Ventricular Tachycardia and Supraventricular Tachycardia were detected within +/- 45 seconds of symptomatic patient event(s). Some episodes of Supraventricular Tachycardia may be possible Atrial Tachycardia with variable block. Isolated SVEs were rare (<1.0%), SVE Couplets were rare (<1.0%), and SVE Triplets were rare (<1.0%). Isolated VEs were rare (<1.0%), and no VE Couplets or VE Triplets were present.  Coronary CTA 03/22/21: FINDINGS: A 100 kV prospective scan was triggered in the descending thoracic aorta at 111 HU's. Axial non-contrast 3 mm slices were carried out through the  heart. The data set was analyzed on a dedicated work station and scored using the Agatson method. Gantry rotation speed was 250 msecs and collimation was .6 mm. No beta blockade and 0.8 mg of sl NTG was given. The 3D data set was reconstructed in 5% intervals of the 67-82 % of the R-R cycle. Diastolic phases were analyzed on a dedicated work station using MPR, MIP and VRT modes. The patient received 95 cc of contrast.   Aorta:  Normal size.  No calcifications.  No dissection.   Main Pulmonary Artery: Normal size of the pulmonary artery.   Aortic Valve:  Tri-leaflet.  No calcifications.   Coronary Arteries:  Normal coronary origin.  Right dominance.   Coronary Calcium Score:   Left main: 0   Left anterior descending artery: 0   Left circumflex artery: 0   Right coronary artery: 0   Ramus intermedius artery: 0   Total: 0   Percentile: 1st for age, sex, and race matched control.   RCA is a large dominant artery that gives rise to PDA and  PLA. There is no plaque.   Left main is a large artery that gives rise to LAD and LCX arteries. There is no plaque.   LAD is a large vessel that gives rise to one D1 vessel. There is no plaque   LCX is a non-dominant artery that gives rise to one large OM1 branch. There is no plaque.   There is a ramus intermedius vessel.  There is no plaque.   Other findings:   Normal variant pulmonary vein drainage into the left atrium: common left PV trunk.   Normal left atrial appendage without a thrombus.   Extra-cardiac findings: See attached radiology report for non-cardiac structures.   IMPRESSION: 1. Coronary calcium score of 0. This was 1st percentile for age, sex, and race matched control.   2. Normal coronary origin with right dominance.   3. CAD-RADS 0. No evidence of CAD (0%). Consider non-atherosclerotic causes of chest pain.   EKG:  EKG is  ordered today.  The ekg ordered today demonstrates NSR with HR 66  Recent  Labs: 11/14/2020: BUN 15; Creatinine, Ser 0.81; Potassium 4.5; Sodium 142  Recent Lipid Panel No results found for: CHOL, TRIG, HDL, CHOLHDL, VLDL, LDLCALC, LDLDIRECT    Physical Exam:    VS:  There were no vitals taken for this visit.    Wt Readings from Last 3 Encounters:  11/01/20 202 lb (91.6 kg)  02/13/17 218 lb (98.9 kg)  09/05/15 211 lb 9.6 oz (96 kg)     GEN:  Well nourished, well developed in no acute distress HEENT: Normal NECK: No JVD; No carotid bruits CARDIAC: RRR, no murmurs, rubs, gallops RESPIRATORY:  Clear to auscultation without rales, wheezing or rhonchi  ABDOMEN: Soft, non-tender, non-distended MUSCULOSKELETAL:  No edema; No deformity  SKIN: Warm and dry NEUROLOGIC:  Alert and oriented x 3 PSYCHIATRIC:  Normal affect   ASSESSMENT:    No diagnosis found.  PLAN:    In order of problems listed above:  #Palpitations: Cardiac monitor reassuring with no evidence of sustained arrhythmias. Triggered events mainly correlated with SVEs (rare). 10 episodes of nonsustained SVT, 1 episode NSVT lasting 12 beats. TTE with normal EF. Coronary CTA without evidence of obstructive disease.  -Continue propranolol for symptom management  #Non-cardiac Chest Pain: Coronary CTA 02/2021 without obstructive disease. Ca score 0.  -Continue lifestyle modifications  #HTN: -Continue valsartan 80mg  daily -Monitor blood pressure at home with goal BP <120/80s   Medication Adjustments/Labs and Tests Ordered: Current medicines are reviewed at length with the patient today.  Concerns regarding medicines are outlined above.  No orders of the defined types were placed in this encounter.  No orders of the defined types were placed in this encounter.   There are no Patient Instructions on file for this visit.    Signed, , MD  04/21/2021 7:54 AM    [facility]

## 2021-04-28 ENCOUNTER — Ambulatory Visit: Payer: 59 | Admitting: Cardiology

## 2021-10-26 ENCOUNTER — Other Ambulatory Visit: Payer: Self-pay

## 2021-10-26 MED ORDER — VALSARTAN 80 MG PO TABS: 80.0000 mg | ORAL_TABLET | Freq: Every day | ORAL | 0 refills | Status: DC

## 2022-03-05 ENCOUNTER — Other Ambulatory Visit (HOSPITAL_COMMUNITY): Payer: Self-pay

## 2022-03-05 MED ORDER — MOUNJARO 5 MG/0.5ML ~~LOC~~ SOAJ
5.0000 mg | SUBCUTANEOUS | 1 refills | Status: DC
  Filled 2022-03-05: qty 2, 28d supply, fill #0
  Filled 2022-03-28: qty 2, 28d supply, fill #1

## 2022-03-28 ENCOUNTER — Other Ambulatory Visit (HOSPITAL_COMMUNITY): Payer: Self-pay

## 2022-08-31 IMAGING — CT CT HEART MORP W/ CTA COR W/ SCORE W/ CA W/CM &/OR W/O CM
1 series · 5 of 9 positions shown, 7 images · non-contrast
Comparison: None.
COMPARISON: None.

Addendum:
EXAM:
OVER-READ INTERPRETATION  CT CHEST

The following report is an over-read performed by radiologist Dr.
over-read does not include interpretation of cardiac or coronary
anatomy or pathology. The Coronary CTA interpretation by the
cardiologist is attached.
TECHNIQUE: The patient was scanned on a Phillips Force scanner.

[Series 461: — · 0.41mm/px · 5 of 9 slices shown, 7 images]
[im 2/9  vessel]
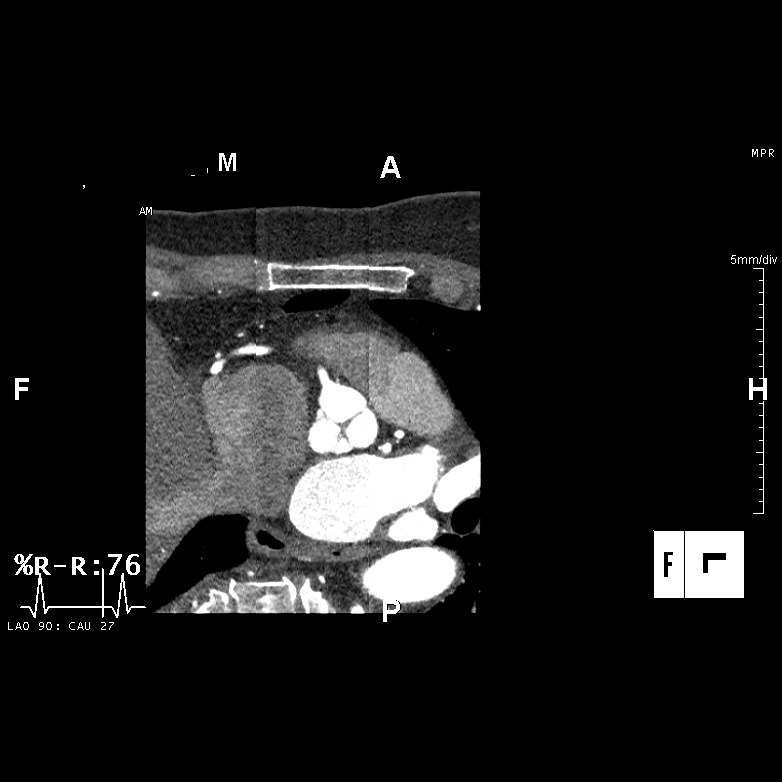
[im 2/9  lung]
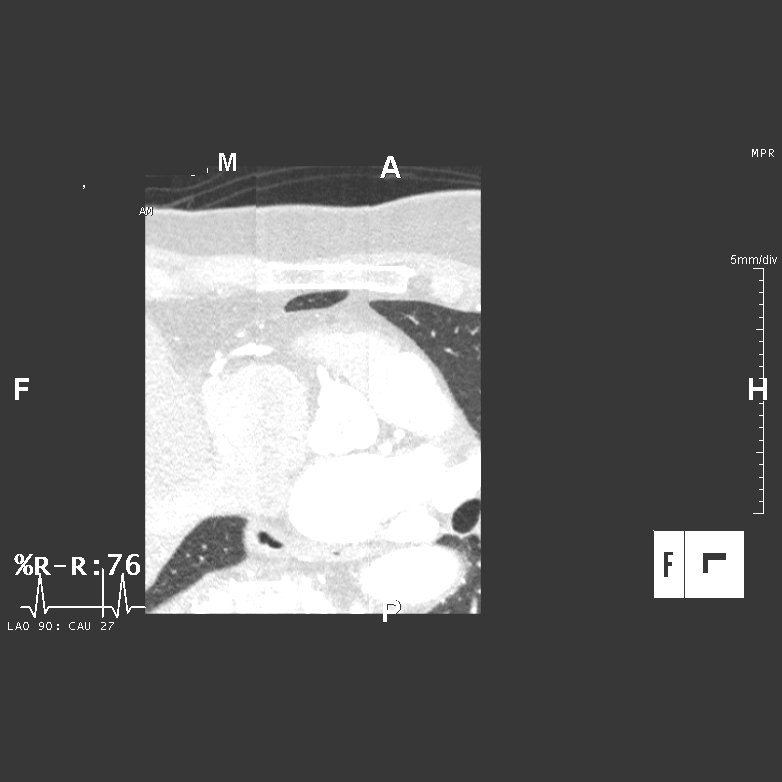
[im 4/9  vessel]
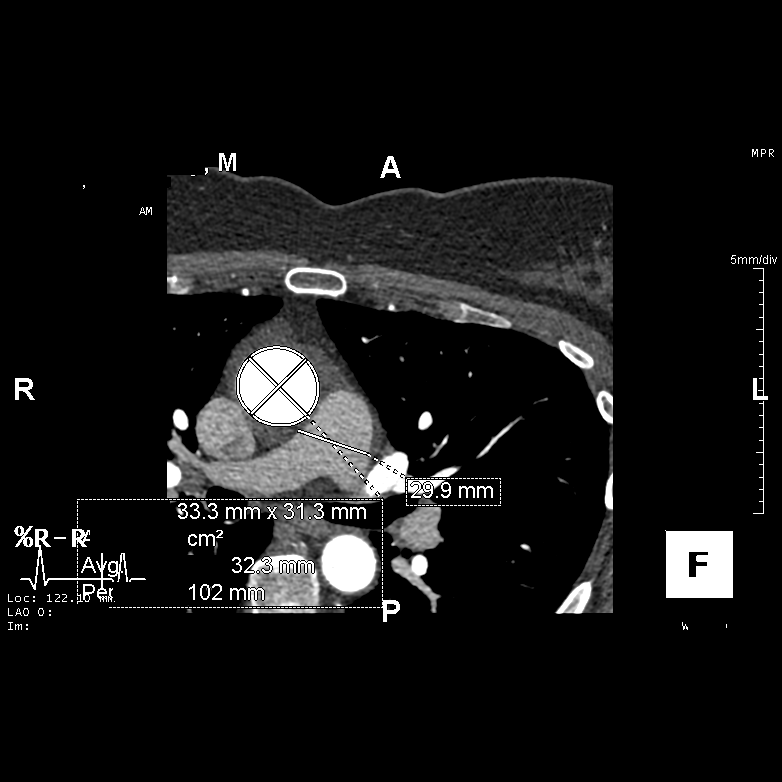
[im 5/9  vessel]
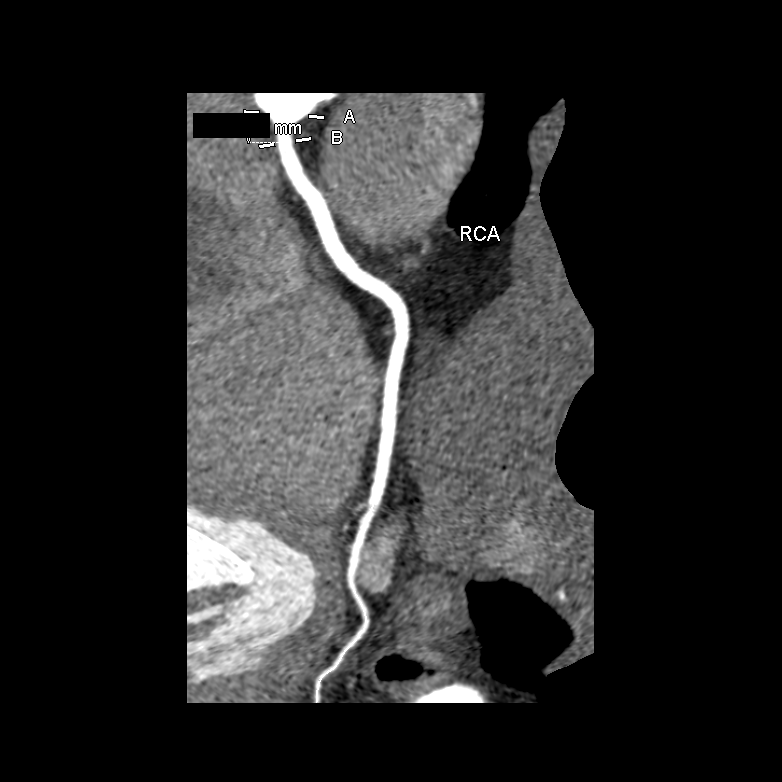
[im 6/9  vessel]
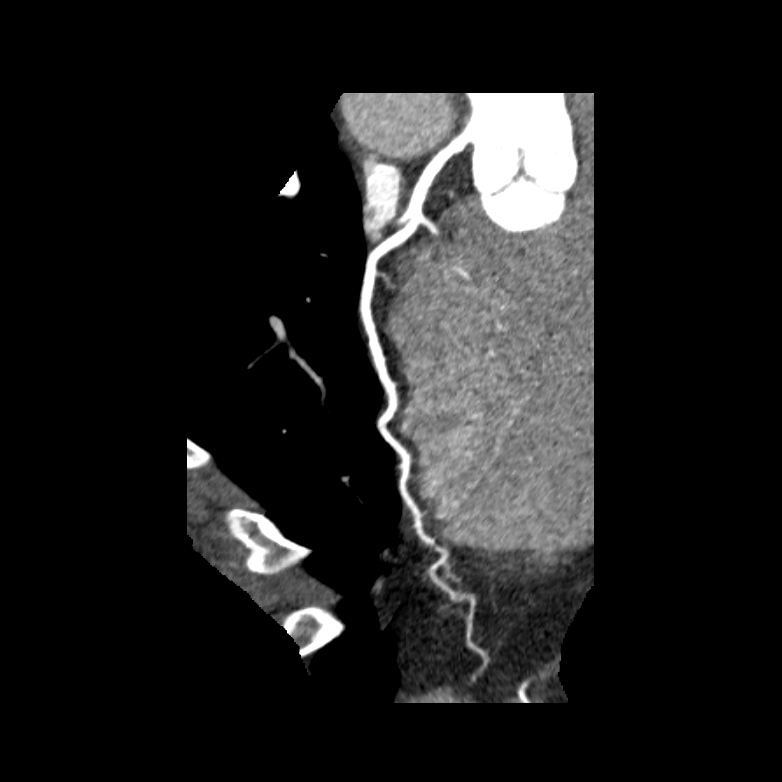
[im 8/9  vessel]
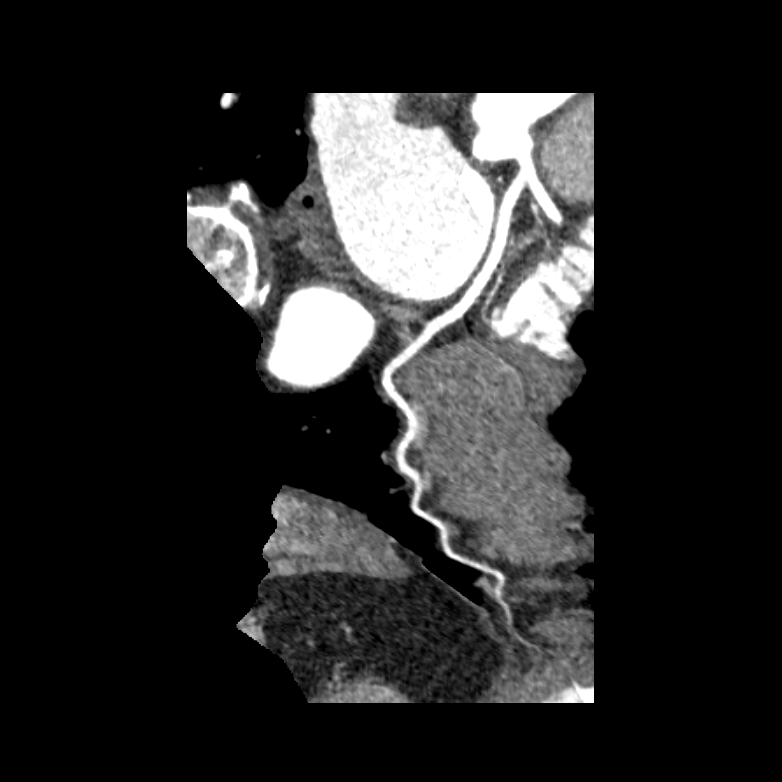
[im 8/9  lung]
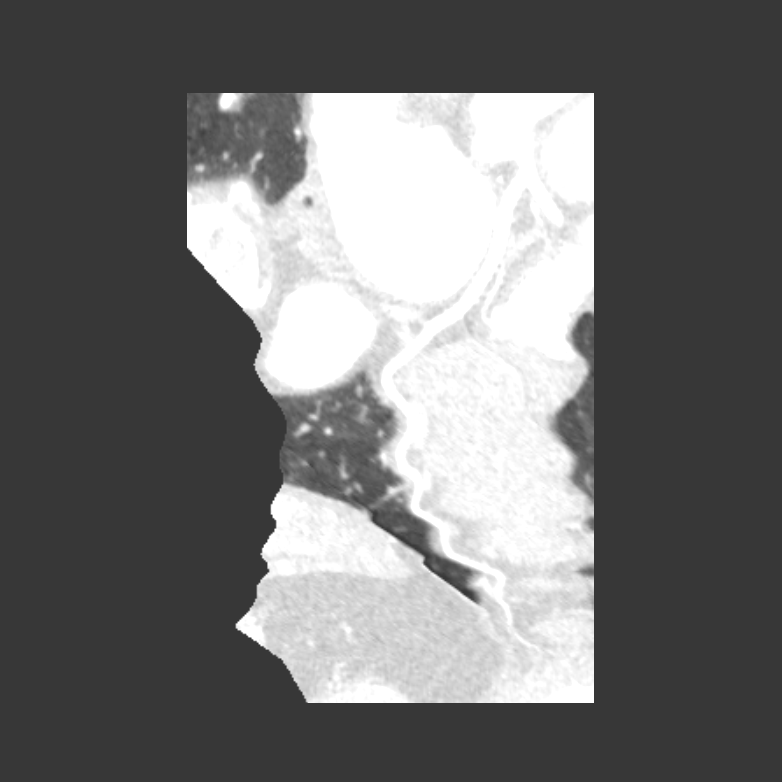

[5 of 9 positions shown; findings below may reference images not displayed]

FINDINGS: Vascular: No significant vascular findings. Normal heart size. No
pericardial effusion.

Mediastinum/Nodes: Tiny hiatal hernia. No lymphadenopathy in the
visualized mediastinum or hilar regions.

Lungs/Pleura: Tiny nodules in the right middle lobe measures 3 mm
and 4 mm respectively on images 17 and 35 of series 11. No overtly
suspicious nodule or mass. There is some subsegmental atelectasis in
the dependent lungs bilaterally.

Upper Abdomen: Unremarkable.

Musculoskeletal: No worrisome lytic or sclerotic osseous
abnormality.
IMPRESSION: 1. No acute extracardiac findings.
2. Tiny hiatal hernia.
3. Tiny nodules in the right middle lobe measuring up to 4 mm. No
follow-up needed if patient is low-risk (and has no known or
suspected primary neoplasm). Non-contrast chest CT can be considered
in 12 months if patient is high-risk. This recommendation follows
the consensus statement: Guidelines for Management of Incidental
Pulmonary Nodules Detected on CT Images: From the [REDACTED]AL DATA:  59 Year old White Female

EXAM:
Cardiac/Coronary  CTA
FINDINGS: A 100 kV prospective scan was triggered in the descending thoracic
aorta at 111 HU's. Axial non-contrast 3 mm slices were carried out
through the heart. The data set was analyzed on a dedicated work
station and scored using the Agatson method. Gantry rotation speed
was 250 msecs and collimation was .6 mm. No beta blockade and 0.8 mg
of sl NTG was given. The 3D data set was reconstructed in 5%
intervals of the 67-82 % of the R-R cycle. Diastolic phases were
analyzed on a dedicated work station using MPR, MIP and VRT modes.
The patient received 95 cc of contrast.

Aorta:  Normal size.  No calcifications.  No dissection.

Main Pulmonary Artery: Normal size of the pulmonary artery.

Aortic Valve:  Tri-leaflet.  No calcifications.

Coronary Arteries:  Normal coronary origin.  Right dominance.

Coronary Calcium Score:

Left main: 0

Left anterior descending artery: 0

Left circumflex artery: 0

Right coronary artery: 0

Ramus intermedius artery: 0

Total: 0

Percentile: 1st for age, sex, and race matched control.

RCA is a large dominant artery that gives rise to PDA and PLA. There
is no plaque.

Left main is a large artery that gives rise to LAD and LCX arteries.
There is no plaque.

LAD is a large vessel that gives rise to one D1 vessel. There is no
plaque

LCX is a non-dominant artery that gives rise to one large OM1
branch. There is no plaque.

There is a ramus intermedius vessel.  There is no plaque.

Other findings:

Normal variant pulmonary vein drainage into the left atrium: common
left PV trunk.

Normal left atrial appendage without a thrombus.

Extra-cardiac findings: See attached radiology report for
non-cardiac structures.
IMPRESSION: 1. Coronary calcium score of 0. This was 1st percentile for age,
sex, and race matched control.

2. Normal coronary origin with right dominance.

3. CAD-RADS 0. No evidence of CAD (0%). Consider non-atherosclerotic
causes of chest pain.

RECOMMENDATIONS:



If CAC = 0, it is reasonable to withhold statin therapy and reassess
in 5 to 10 years, as long as higher risk conditions are absent
(diabetes mellitus, family history of premature CHD in first degree
relatives (males <55 years; females <65 years), cigarette smoking,
LDL >=190 mg/dL or other independent risk factors).

If CAC is 1 to 99, it is reasonable to initiate statin therapy for
patients ?55 years of age.

If CAC is >=100 or >=75th percentile, it is reasonable to initiate
statin therapy at any age.

Cardiology referral should be considered for patients with CAC
scores =400 or >=75th percentile.

*6603 AHA/ACC/AACVPR/AAPA/ABC/WILSON/BENRABAH/KRISTIN/Oxendine/YANS/BENRABAH/KIM PRETINHO
Guideline on the Management of Blood Cholesterol: A Report of the
American College of Cardiology/American Heart Association Task Force
on Clinical Practice Guidelines. J Am Coll Cardiol.
8535;73(24):3772-3014.

*** End of Addendum ***
EXAM:
OVER-READ INTERPRETATION  CT CHEST

The following report is an over-read performed by radiologist Dr.
over-read does not include interpretation of cardiac or coronary
anatomy or pathology. The Coronary CTA interpretation by the
cardiologist is attached.
FINDINGS: Vascular: No significant vascular findings. Normal heart size. No
pericardial effusion.

Mediastinum/Nodes: Tiny hiatal hernia. No lymphadenopathy in the
visualized mediastinum or hilar regions.

Lungs/Pleura: Tiny nodules in the right middle lobe measures 3 mm
and 4 mm respectively on images 17 and 35 of series 11. No overtly
suspicious nodule or mass. There is some subsegmental atelectasis in
the dependent lungs bilaterally.

Upper Abdomen: Unremarkable.

Musculoskeletal: No worrisome lytic or sclerotic osseous
abnormality.
IMPRESSION: 1. No acute extracardiac findings.
2. Tiny hiatal hernia.
3. Tiny nodules in the right middle lobe measuring up to 4 mm. No
follow-up needed if patient is low-risk (and has no known or
suspected primary neoplasm). Non-contrast chest CT can be considered
in 12 months if patient is high-risk. This recommendation follows
the consensus statement: Guidelines for Management of Incidental
Pulmonary Nodules Detected on CT Images: From the [HOSPITAL]

## 2022-12-10 ENCOUNTER — Encounter (HOSPITAL_BASED_OUTPATIENT_CLINIC_OR_DEPARTMENT_OTHER): Payer: Self-pay

## 2022-12-10 ENCOUNTER — Ambulatory Visit (HOSPITAL_BASED_OUTPATIENT_CLINIC_OR_DEPARTMENT_OTHER): Admit: 2022-12-10 | Payer: 59 | Admitting: Obstetrics and Gynecology

## 2022-12-10 SURGERY — SALPINGO-OOPHORECTOMY, LAPAROSCOPIC
Anesthesia: General

## 2023-02-04 ENCOUNTER — Other Ambulatory Visit (HOSPITAL_BASED_OUTPATIENT_CLINIC_OR_DEPARTMENT_OTHER): Payer: Self-pay

## 2023-04-10 ENCOUNTER — Ambulatory Visit: Payer: 59 | Admitting: Orthopaedic Surgery

## 2023-05-08 ENCOUNTER — Ambulatory Visit: Payer: 59 | Admitting: Orthopaedic Surgery

## 2023-05-08 ENCOUNTER — Other Ambulatory Visit (INDEPENDENT_AMBULATORY_CARE_PROVIDER_SITE_OTHER): Payer: 59

## 2023-05-08 DIAGNOSIS — M25552 Pain in left hip: Secondary | ICD-10-CM

## 2023-05-08 NOTE — Progress Notes (Signed)
The patient is a very pleasant 62 year old female who comes in for evaluation treatment of left hip pain.  She says it is more like a toothache and does occur during walking.  She points to actually near the groin area as a source of her pain and not so much lateral.  She denies any injury.  It does not wake her up at night.  If she has been sitting for a while it does become painful to her.  She has tried just oral anti-inflammatories.  She is not obese.  She is not a diabetic.  I was able to review her medications and and past medical history within epic.  Examination of her left and right hip show they move smoothly and fluidly with no blocks to rotation and no pain in the groin on my exam today.  Even compression of the hips are normal.  I do have her lay on her side her left hip is examined and there is no pain over the trochanteric area or the IT band.  When I have her lay supine her hip flexion at the extreme of flexion does not cause severe pain nor does rotation of the hip either.  An AP pelvis and lateral of the right hip she has just some slight narrowing of the superior lateral aspect of the hip joint on the AP view but the lateral view looks great and there is no acute findings and no irregularities of the femoral head.  I went over her x-rays with her and her clinical exam findings.  I even showed her hip model.  Certainly what she describes may be some type of tear of the labrum.  I would like to send her to my partner Dr. Shon Baton for an ultrasound assessment of her left hip and an intra-articular steroid injection in that left hip joint.  He can then get her back to me about 2 to 3 weeks after the injection and we can see if this is helped her.  All questions and concerns were answered and addressed.  She agrees with this treatment plan.

## 2023-05-14 NOTE — Progress Notes (Unsigned)
New Patient Note  RE: [name] MRN: [id number] DOB: [birth year] Date of Office Visit: 05/15/2023  Consult requested by: [name], MD Primary care provider: [name], MD  Chief Complaint: No chief complaint on file.  History of Present Illness: I had the pleasure of seeing Christine Knapp for initial evaluation at the [facility] on 05/14/2023. She is a 62 y.o. female, who is referred here by [name], MD for the evaluation of ***.  ***  Assessment and Plan: [name] is a 62 y.o. female with: ***  No follow-ups on file.  No orders of the defined types were placed in this encounter.  Lab Orders  No laboratory test(s) ordered today    Other allergy screening: Asthma: {Blank single:19197::"yes","no"} Rhino conjunctivitis: {Blank single:19197::"yes","no"} Food allergy: {Blank single:19197::"yes","no"} Medication allergy: {Blank single:19197::"yes","no"} Hymenoptera allergy: {Blank single:19197::"yes","no"} Urticaria: {Blank single:19197::"yes","no"} Eczema:{Blank single:19197::"yes","no"} History of recurrent infections suggestive of immunodeficency: {Blank single:19197::"yes","no"}  Diagnostics: Spirometry:  Tracings reviewed. Her effort: {Blank single:19197::"Good reproducible efforts.","It was hard to get consistent efforts and there is a question as to whether this reflects a maximal maneuver.","Poor effort, data can not be interpreted."} FVC: ***L FEV1: ***L, ***% predicted FEV1/FVC ratio: ***% Interpretation: {Blank single:19197::"Spirometry consistent with mild obstructive disease","Spirometry consistent with moderate obstructive disease","Spirometry consistent with severe obstructive disease","Spirometry consistent with possible restrictive disease","Spirometry consistent with mixed obstructive and restrictive disease","Spirometry uninterpretable due to technique","Spirometry consistent with normal pattern","No  overt abnormalities noted given today's efforts"}.  Please see scanned spirometry results for details.  Skin Testing: {Blank single:19197::"Select foods","Environmental allergy panel","Environmental allergy panel and select foods","Food allergy panel","None","Deferred due to recent antihistamines use"}. *** Results discussed with patient/family.   Past Medical History: Patient Active Problem List   Diagnosis Date Noted  . 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 HYPERLIPIDEMIA 10/28/2007  . OBSTRUCTIVE SLEEP APNEA 10/28/2007  . ALLERGIC CONJUNCTIVITIS 10/28/2007  . MITRAL REGURGITATION, MILD 10/28/2007  . PREMATURE VENTRICULAR CONTRACTIONS 10/28/2007  . PALPITATIONS, HX OF 10/28/2007   Past Medical History:  Diagnosis Date  . Allergic rhinitis   . Heart murmur   . Migraine   . OSA (obstructive sleep apnea)    2003.  AHI 31,  2005 AHI3/hr-no therapy  . Palpitation   . REM behavioral disorder    w/ question of night terrors  . Shingles    recurrent   Past Surgical History: Past Surgical History:  Procedure Laterality Date  . TONSILLECTOMY    . TOTAL ABDOMINAL HYSTERECTOMY     Medication List:  Current Outpatient Medications  Medication Sig Dispense Refill  . clonazePAM (KLONOPIN) 0.5 MG tablet TAKE 1 TO 2 TABLETS AT BEDTIME AS NEEDED 60 tablet 5  . clonazePAM (KLONOPIN) 1 MG tablet Take 1 mg by mouth at bedtime.    . hydrOXYzine (VISTARIL) 25 MG capsule Take 25 mg by mouth as needed.    Marland Kitchen ipratropium (ATROVENT) 0.06 % nasal spray Place 2 sprays into both nostrils 3 (three) times daily as needed.    . metoprolol tartrate (LOPRESSOR) 50 MG tablet Take one tablet by mouth 2 hours prior to CT Scan 1 tablet 0  . Multiple Vitamin (MULTI-VITAMIN) tablet Take 1 tablet by mouth daily.    . prazosin (MINIPRESS) 2 MG capsule Take 2 mg by mouth at bedtime.    . propranolol (INDERAL) 60 MG tablet Take 2 tablets (120 mg total) by mouth at bedtime. 60 tablet 0  . sertraline (ZOLOFT)  25 MG tablet Take 25 mg  by mouth daily.    . SUMAtriptan (IMITREX) 50 MG tablet Take 1 tablet by mouth as needed.    . tirzepatide (MOUNJARO) 5 MG/0.5ML Pen Inject 5 mg into the skin once a week. 2 mL 1  . valACYclovir (VALTREX) 1000 MG tablet Take 1,000 mg by mouth 3 (three) times daily as needed.    . valsartan (DIOVAN) 80 MG tablet Take 1 tablet (80 mg total) by mouth daily. Please make over due appt for future refills Thank you 1st attempt 30 tablet 0  . Vitamin D, Ergocalciferol, (DRISDOL) 50000 UNITS CAPS capsule Take 1 capsule by mouth once a week.  5   No current facility-administered medications for this visit.   Allergies: Allergies  Allergen Reactions  . Aspirin Swelling  . Celebrex [Celecoxib] Swelling  . Nsaids Swelling  . Sulfonamide Derivatives   . Zolpidem Tartrate Other (See Comments)    Headaches   Social History: Social History   Socioeconomic History  . Marital status: Married    Spouse name: Not on file  . Number of children: 2  . Years of education: Not on file  . Highest education level: Not on file  Occupational History  . Occupation: Art therapist: [employer]  Tobacco Use  . Smoking status: Never  . Smokeless tobacco: Never  Vaping Use  . Vaping status: Never Used  Substance and Sexual Activity  . Alcohol use: No  . Drug use: No  . Sexual activity: Not on file  Other Topics Concern  . Not on file  Social History Narrative  . Not on file   Social Determinants of Health   Financial Resource Strain: Not on file  Food Insecurity: Not on file  Transportation Needs: Not on file  Physical Activity: Not on file  Stress: Not on file  Social Connections: Not on file   Lives in a ***. Smoking: *** Occupation: ***  Environmental HistorySurveyor, minerals in the house: Copywriter, advertising in the family room: {Blank single:19197::"yes","no"} Carpet in the bedroom: {Blank single:19197::"yes","no"} Heating:  {Blank single:19197::"electric","gas","heat pump"} Cooling: {Blank single:19197::"central","window","heat pump"} Pet: {Blank single:19197::"yes ***","no"}  Family History: Family History  Problem Relation Age of Onset  . Other Father        sleep waljer into his teens  . Other Mother        active dreamer   Problem                               Relation Asthma                                   *** Eczema                                *** Food allergy                          *** Allergic rhino conjunctivitis     ***  Review of Systems  Constitutional:  Negative for appetite change, chills, fever and unexpected weight change.  HENT:  Negative for congestion and rhinorrhea.   Eyes:  Negative for itching.  Respiratory:  Negative for cough, chest tightness, shortness of breath and wheezing.   Cardiovascular:  Negative for chest pain.  Gastrointestinal:  Negative for abdominal pain.  Genitourinary:  Negative for difficulty urinating.  Skin:  Negative for rash.  Neurological:  Negative for headaches.   Objective: There were no vitals taken for this visit. There is no height or weight on file to calculate BMI. Physical Exam Vitals and nursing note reviewed.  Constitutional:      Appearance: Normal appearance. She is well-developed.  HENT:     Head: Normocephalic and atraumatic.     Right Ear: Tympanic membrane and external ear normal.     Left Ear: Tympanic membrane and external ear normal.     Nose: Nose normal.     Mouth/Throat:     Mouth: Mucous membranes are moist.     Pharynx: Oropharynx is clear.  Eyes:     Conjunctiva/sclera: Conjunctivae normal.  Cardiovascular:     Rate and Rhythm: Normal rate and regular rhythm.     Heart sounds: Normal heart sounds. No murmur heard.    No friction rub. No gallop.  Pulmonary:     Effort: Pulmonary effort is normal.     Breath sounds: Normal breath sounds. No wheezing, rhonchi or rales.  Musculoskeletal:     Cervical back:  Neck supple.  Skin:    General: Skin is warm.     Findings: No rash.  Neurological:     Mental Status: She is alert and oriented to person, place, and time.  Psychiatric:        Behavior: Behavior normal.  The plan was reviewed with the patient/family, and all questions/concerned were addressed.  It was my pleasure to see Christine Knapp today and participate in her care. Please feel free to contact me with any questions or concerns.  Sincerely,  Wyline Mood, DO Allergy & Immunology  [facility] office: [phone] [facility] office: [phone]

## 2023-05-15 ENCOUNTER — Encounter: Payer: Self-pay | Admitting: Allergy

## 2023-05-15 ENCOUNTER — Ambulatory Visit (INDEPENDENT_AMBULATORY_CARE_PROVIDER_SITE_OTHER): Payer: 59 | Admitting: Allergy

## 2023-05-15 ENCOUNTER — Other Ambulatory Visit: Payer: Self-pay

## 2023-05-15 VITALS — BP 140/88 | HR 73 | Temp 97.3°F | Resp 18 | Ht 67.0 in | Wt 149.1 lb

## 2023-05-15 DIAGNOSIS — Z886 Allergy status to analgesic agent status: Secondary | ICD-10-CM

## 2023-05-15 DIAGNOSIS — L299 Pruritus, unspecified: Secondary | ICD-10-CM

## 2023-05-15 DIAGNOSIS — J3089 Other allergic rhinitis: Secondary | ICD-10-CM | POA: Diagnosis not present

## 2023-05-15 DIAGNOSIS — R21 Rash and other nonspecific skin eruption: Secondary | ICD-10-CM | POA: Diagnosis not present

## 2023-05-15 MED ORDER — FAMOTIDINE 20 MG PO TABS: 20.0000 mg | ORAL_TABLET | Freq: Two times a day (BID) | ORAL | 1 refills | Status: DC

## 2023-05-15 MED ORDER — TRIAMCINOLONE ACETONIDE 0.1 % EX OINT: 1.0000 | TOPICAL_OINTMENT | Freq: Two times a day (BID) | CUTANEOUS | 1 refills | Status: AC | PRN

## 2023-05-15 NOTE — Patient Instructions (Addendum)
Rash  Etiology unclear. Start zyrtec (cetirizine) 10mg  OR allegra (fexofenadine) 180mg  twice a day. If symptoms are not controlled or causes drowsiness let us know. Start Pepcid (famotidine) 20mg  twice a day.  Avoid the following potential triggers: alcohol, tight clothing, NSAIDs, hot showers and getting overheated. See below for proper skin care.  Use triamcinolone 0.1% ointment twice a day as needed for rash flares. Do not use on the face, neck, armpits or groin area. Do not use more than 3 weeks in a row.  Get bloodwork.  We are ordering labs, so please allow 1-2 weeks for the results to come back. With the newly implemented Cures Act, the labs might be visible to you at the same time that they become visible to me. However, I will not address the results until all of the results are back, so please be patient.   If it flares - let me know and I can sent in a short burst of prednisone if needed.  Return in about 2 weeks (around 05/29/2023) for Skin testing. Or sooner if needed.  Skin care recommendations  Bath time: Always use lukewarm water. AVOID very hot or cold water. Keep bathing time to 5-10 minutes. Do NOT use bubble bath. Use a mild soap and use just enough to wash the dirty areas. Do NOT scrub skin vigorously.  After bathing, pat dry your skin with a towel. Do NOT rub or scrub the skin.  Moisturizers and prescriptions:  ALWAYS apply moisturizers immediately after bathing (within 3 minutes). This helps to lock-in moisture. Use the moisturizer several times a day over the whole body. Good summer moisturizers include: Aveeno, CeraVe, Cetaphil. Good winter moisturizers include: Aquaphor, Vaseline, Cerave, Cetaphil, Eucerin, Vanicream. When using moisturizers along with medications, the moisturizer should be applied about one hour after applying the medication to prevent diluting effect of the medication or moisturize around where you applied the medications. When not using  medications, the moisturizer can be continued twice daily as maintenance.  Laundry and clothing: Avoid laundry products with added color or perfumes. Use unscented hypo-allergenic laundry products such as Tide free, Cheer free & gentle, and All free and clear.  If the skin still seems dry or sensitive, you can try double-rinsing the clothes. Avoid tight or scratchy clothing such as wool. Do not use fabric softeners or dyer sheets.

## 2023-05-17 ENCOUNTER — Ambulatory Visit: Payer: 59 | Admitting: Sports Medicine

## 2023-05-21 LAB — CBC WITH DIFFERENTIAL/PLATELET
Basophils Absolute: 0.1 10*3/uL (ref 0.0–0.2)
Basos: 1 %
EOS (ABSOLUTE): 0.2 10*3/uL (ref 0.0–0.4)
Eos: 4 %
Hematocrit: 38.8 % (ref 34.0–46.6)
Hemoglobin: 12.8 g/dL (ref 11.1–15.9)
Immature Grans (Abs): 0 10*3/uL (ref 0.0–0.1)
Immature Granulocytes: 0 %
Lymphocytes Absolute: 1.8 10*3/uL (ref 0.7–3.1)
Lymphs: 35 %
MCH: 26.2 pg — ABNORMAL LOW (ref 26.6–33.0)
MCHC: 33 g/dL (ref 31.5–35.7)
MCV: 79 fL (ref 79–97)
Monocytes Absolute: 0.3 10*3/uL (ref 0.1–0.9)
Monocytes: 6 %
Neutrophils Absolute: 2.9 10*3/uL (ref 1.4–7.0)
Neutrophils: 54 %
Platelets: 262 10*3/uL (ref 150–450)
RBC: 4.89 x10E6/uL (ref 3.77–5.28)
RDW: 13 % (ref 11.7–15.4)
WBC: 5.3 10*3/uL (ref 3.4–10.8)

## 2023-05-21 LAB — COMPREHENSIVE METABOLIC PANEL
ALT: 11 IU/L (ref 0–32)
AST: 26 IU/L (ref 0–40)
Albumin: 4.7 g/dL (ref 3.8–4.9)
Alkaline Phosphatase: 59 IU/L (ref 44–121)
BUN/Creatinine Ratio: 19 (ref 12–28)
BUN: 18 mg/dL (ref 8–27)
Bilirubin Total: 0.4 mg/dL (ref 0.0–1.2)
CO2: 22 mmol/L (ref 20–29)
Calcium: 9.5 mg/dL (ref 8.7–10.3)
Chloride: 100 mmol/L (ref 96–106)
Creatinine, Ser: 0.97 mg/dL (ref 0.57–1.00)
Globulin, Total: 2.3 g/dL (ref 1.5–4.5)
Glucose: 90 mg/dL (ref 70–99)
Potassium: 4.5 mmol/L (ref 3.5–5.2)
Sodium: 135 mmol/L (ref 134–144)
Total Protein: 7 g/dL (ref 6.0–8.5)
eGFR: 67 mL/min/{1.73_m2} (ref >59)

## 2023-05-21 LAB — TRYPTASE: Tryptase: 6.5 ug/L (ref 2.2–13.2)

## 2023-05-21 LAB — THYROID CASCADE PROFILE: TSH: 2.28 u[IU]/mL (ref 0.450–4.500)

## 2023-05-21 LAB — CHRONIC URTICARIA: cu index: 3.4 (ref <10)

## 2023-05-21 LAB — C3 AND C4
Complement C3, Serum: 114 mg/dL (ref 82–167)
Complement C4, Serum: 20 mg/dL (ref 12–38)

## 2023-05-21 LAB — ALPHA-GAL PANEL
Allergen Lamb IgE: 0.16 kU/L — AB
Beef IgE: 0.44 kU/L — AB
IgE (Immunoglobulin E), Serum: 21 [IU]/mL (ref 6–495)
O215-IgE Alpha-Gal: 1.14 kU/L — AB
Pork IgE: 0.26 kU/L — AB

## 2023-05-21 LAB — ANA W/REFLEX: Anti Nuclear Antibody (ANA): NEGATIVE

## 2023-05-21 LAB — C-REACTIVE PROTEIN: CRP: <1 mg/L (ref 0–10)

## 2023-05-21 LAB — SEDIMENTATION RATE: Sed Rate: 12 mm/h (ref 0–40)

## 2023-05-28 NOTE — Progress Notes (Signed)
Follow Up Note  RE: Christine Knapp MRN: [id number] DOB: [birth year] Date of Office Visit: 05/29/2023  Referring provider: [name], MD Primary care provider: [name], MD  Chief Complaint: Allergy Testing  History of Present Illness: I had the pleasure of seeing Christine Knapp for a follow up visit at the [facility] on 05/29/2023. She is a 62 y.o. female, who is being followed for rash, allergic rhinitis and NSAID allergy. Her previous allergy office visit was on 05/15/2023 with Dr. Selena Batten. Today is a skin testing and follow up visit.  Rash and other nonspecific skin eruption Pruritus Patient took zyrtec 10mg  once a day and famotidine 20mg  BID and eliminated red meat from her diet and had no rash/itching.  Sometimes gets diarrhea/abdominal pain after eating eggs about 75% of the time. Dairy causes GI issues.   Dietary History: patient has been eating other foods including peanut, treenuts, sesame, shellfish, fish, soy, wheat, meats, fruits and vegetables.  Still eating some eggs at a time.   Other allergic rhinitis Increased nasal congestion since stopping antihistamines.   Assessment and Plan: Christine Knapp is a 62 y.o. female with:  Other allergic rhinitis Seasonal allergic rhinitis due to pollen Allergic rhinitis due to animal dander Allergic rhinitis due to dust mite Allergic rhinitis due to mold Past history - Perennial rhino conjunctivitis symptoms. Nasal spray worsen headaches.  Today's skin testing: positive to grass, ragweed, weed, trees, dust mites, dog. Borderline to mold, horse. Start environmental control measures as below. Use over the counter antihistamines such as Zyrtec (cetirizine), Claritin (loratadine), Allegra (fexofenadine), or Xyzal (levocetirizine) daily as needed. May take twice a day during allergy flares. May switch antihistamines every few months. Nasal saline spray (i.e., Simply Saline) or nasal saline lavage (i.e.,  NeilMed) is recommended as needed. Consider allergy injections for long term control if above medications do not help the symptoms - handout given.  Declined nasal sprays.   Rash and other nonspecific skin eruption Other adverse food reactions, not elsewhere classified, subsequent encounter Allergy to alpha-gal Past history - Breaks out in the spring/summer about 4 times per year for the past 4 years which only clears up with systemic steroids. 2024 bloodwork (CBC diff, CMP, TSH, ANA, esr, crp, CU, tryptase) all normal; alpha gal positive.  Interim history - no outbreaks but also stopped eating red meat and used antihistamines.  Possibly from alpha gal allergy. Continue to avoid all mammalian meat - handout given. I have prescribed epinephrine injectable device and demonstrated proper use. For mild symptoms you can take over the counter antihistamines such as Benadryl 1-2 tablets = 25-50mg  and monitor symptoms closely. If symptoms worsen or if you have severe symptoms including breathing issues, throat closure, significant swelling, whole body hives, severe diarrhea and vomiting, lightheadedness then inject epinephrine and seek immediate medical care afterwards. Emergency action plan given. If you start to break out again then:  Start zyrtec (cetirizine) 10mg  OR allegra (fexofenadine) 180mg  twice a day. If symptoms are not controlled or causes drowsiness let us know. Start Pepcid (famotidine) 20mg  twice a day.  Avoid the following potential triggers: alcohol, tight clothing, NSAIDs, hot showers and getting overheated. Continue proper skin care.  Use triamcinolone 0.1% ointment twice a day as needed for rash flares. Do not use on the face, neck, armpits or groin area. Do not use more than 3 weeks in a row.   Food intolerance GI issues with milk and egg at times. Concerned about gluten  as well. Today's skin testing negative to milk, egg and wheat.  Limit egg intake.  Possible lactose  intolerance - may use lactose free milk or take a lactaid pill right before consuming anything with dairy.  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  Return in about 6 months (around 11/26/2023).  Meds ordered this encounter  Medications   EPINEPHrine 0.3 mg/0.3 mL IJ SOAJ injection    Sig: Inject 0.3 mg into the muscle as needed for anaphylaxis.    Dispense:  2 each    Refill:  1    May dispense generic/Mylan/Teva brand.   Lab Orders  No laboratory test(s) ordered today    Diagnostics: Skin Testing: Environmental allergy panel and select foods. Positive to grass, ragweed, weed, trees, dust mites, dog. Borderline to mold, horse. Negative to wheat, milk and egg.  Results discussed with patient/family.  Airborne Adult Perc - 05/29/23 1129     Time Antigen Placed 1129    Allergen Manufacturer Waynette Buttery    Location Back    Number of Test 53    1. Control-Buffer 50% Glycerol Negative    2. Control-Histamine 2+    3. Bahia Negative    4. [place] Southern [place] 3+    5. Johnson 2+    6. Kentucky Blue 2+    7. Meadow Fescue --   +/-   8. Perennial Rye 2+    9. Timothy --   +/-   10. Ragweed Mix Negative    11. Cocklebur Negative    12. Plantain,  English Negative    13. Baccharis Negative    14. Dog Fennel Negative    15. Guernsey Thistle 2+    16. Lamb's Quarters Negative    17. Sheep Sorrell 2+    18. Rough Pigweed 2+    19. Marsh Elder, Rough Negative    20. Mugwort, Common 2+    21. Box, Elder 2+    22. Cedar, red Negative    23. Sweet Gum 2+    24. Pecan Pollen Negative    25. Pine Mix Negative    26. Walnut, Black Pollen Negative    27. Red Mulberry 2+    28. Ash Mix 2+    29. Birch Mix 2+    30. Beech American Negative    31. Cottonwood, Guinea[place]au 2+    32. Hickory, White Negative    33. Maple Mix 2+    34. Oak, [place] Mix Negative    35. Sycamore Eastern Negative    36. Alternaria Alternata Negative    37. Cladosporium Herbarum Negative    38. Aspergillus Mix Negative    39.  Penicillium Mix Negative    40. Bipolaris Sorokiniana (Helminthosporium) Negative    41. Drechslera Spicifera (Curvularia) Negative    42. Mucor Plumbeus Negative    43. Fusarium Moniliforme Negative    44. Aureobasidium Pullulans (pullulara) Negative    45. Rhizopus Oryzae Negative    46. Botrytis Cinera Negative    47. Epicoccum Nigrum Negative    48. Phoma Betae Negative    49. Dust Mite Mix Negative    50. Cat Hair 10,000 BAU/ml Negative    51.  Dog Epithelia Negative    52. Mixed Feathers Negative    53. Horse Epithelia --   +/-   54. Cockroach, German Negative    55. Tobacco Leaf Negative             Intradermal - 05/29/23 1131     Time Antigen  Placed 1131    Allergen Manufacturer Other    Location Arm    Number of Test 10    Control Negative    Bahia 2+    Ragweed Mix 2+    Mold 1 Negative    Mold 2 --   +/-   Mold 3 Negative    Mold 4 --   +/-   Mite Mix 2+    Cat Negative    Dog 2+    Cockroach Negative             Food Adult Perc - 05/29/23 1100     Time Antigen Placed 1129    Allergen Manufacturer Waynette Buttery    Location Back    Number of allergen test 4    3. Wheat Negative    5. Milk, Cow Negative    6. Casein Negative    7. Egg White, Chicken Negative             Medication List:  Current Outpatient Medications  Medication Sig Dispense Refill   atomoxetine (STRATTERA) 80 MG capsule Take 80 mg by mouth every morning.     clonazePAM (KLONOPIN) 1 MG tablet Take 1 mg by mouth at bedtime.     EPINEPHrine 0.3 mg/0.3 mL IJ SOAJ injection Inject 0.3 mg into the muscle as needed for anaphylaxis. 2 each 1   famotidine (PEPCID) 20 MG tablet Take 1 tablet (20 mg total) by mouth 2 (two) times daily. 60 tablet 1   triamcinolone ointment (KENALOG) 0.1 % Apply 1 Application topically 2 (two) times daily as needed (rash flare). Do not use on the face, neck, armpits or groin area. Do not use more than 3 weeks in a row. 30 g 1   No current  facility-administered medications for this visit.   Allergies: Allergies  Allergen Reactions   Alpha-Gal     Positive bloodwork   Aspirin Swelling   Celebrex [Celecoxib] Swelling   Nsaids Swelling   Sulfonamide Derivatives    Zolpidem Tartrate Other (See Comments)    Headaches   I reviewed her past medical history, social history, family history, and environmental history and no significant changes have been reported from her previous visit.  Review of Systems  Constitutional:  Negative for appetite change, chills, fever and unexpected weight change.  HENT:  Positive for congestion, rhinorrhea and sneezing.   Eyes:  Negative for itching.  Respiratory:  Negative for cough, chest tightness, shortness of breath and wheezing.   Cardiovascular:  Negative for chest pain.  Gastrointestinal:  Negative for abdominal pain.  Genitourinary:  Negative for difficulty urinating.  Skin:  Negative for rash.  Allergic/Immunologic: Positive for environmental allergies and food allergies.  Neurological:  Negative for headaches.    Objective: BP 128/84   Pulse 66   Temp 98.3 F (36.8 C) (Temporal)   Resp 16   SpO2 98%  There is no height or weight on file to calculate BMI. Physical Exam Vitals and nursing note reviewed.  Constitutional:      Appearance: Normal appearance. She is well-developed.  HENT:     Head: Normocephalic and atraumatic.     Right Ear: Tympanic membrane and external ear normal.     Left Ear: Tympanic membrane and external ear normal.     Nose: Nose normal.     Mouth/Throat:     Mouth: Mucous membranes are moist.     Pharynx: Oropharynx is clear.  Eyes:     Conjunctiva/sclera: Conjunctivae normal.  Cardiovascular:     Rate and Rhythm: Normal rate and regular rhythm.     Heart sounds: Normal heart sounds. No murmur heard.    No friction rub. No gallop.  Pulmonary:     Effort: Pulmonary effort is normal.     Breath sounds: Normal breath sounds. No wheezing,  rhonchi or rales.  Musculoskeletal:     Cervical back: Neck supple.  Skin:    General: Skin is warm.     Findings: No rash.  Neurological:     Mental Status: She is alert and oriented to person, place, and time.  Psychiatric:        Behavior: Behavior normal.    Previous notes and tests were reviewed. The plan was reviewed with the patient/family, and all questions/concerned were addressed.  It was my pleasure to see Faith today and participate in her care. Please feel free to contact me with any questions or concerns.  Sincerely,  Wyline Mood, DO Allergy & Immunology  [facility] office: [phone] [facility] office: [phone]

## 2023-05-29 ENCOUNTER — Ambulatory Visit (INDEPENDENT_AMBULATORY_CARE_PROVIDER_SITE_OTHER): Payer: 59 | Admitting: Allergy

## 2023-05-29 ENCOUNTER — Encounter: Payer: Self-pay | Admitting: Allergy

## 2023-05-29 VITALS — BP 128/84 | HR 66 | Temp 98.3°F | Resp 16

## 2023-05-29 DIAGNOSIS — J301 Allergic rhinitis due to pollen: Secondary | ICD-10-CM

## 2023-05-29 DIAGNOSIS — J3089 Other allergic rhinitis: Secondary | ICD-10-CM

## 2023-05-29 DIAGNOSIS — T781XXD Other adverse food reactions, not elsewhere classified, subsequent encounter: Secondary | ICD-10-CM | POA: Diagnosis not present

## 2023-05-29 DIAGNOSIS — Z91018 Allergy to other foods: Secondary | ICD-10-CM

## 2023-05-29 DIAGNOSIS — R21 Rash and other nonspecific skin eruption: Secondary | ICD-10-CM

## 2023-05-29 DIAGNOSIS — Z886 Allergy status to analgesic agent status: Secondary | ICD-10-CM | POA: Diagnosis not present

## 2023-05-29 DIAGNOSIS — J3081 Allergic rhinitis due to animal (cat) (dog) hair and dander: Secondary | ICD-10-CM

## 2023-05-29 DIAGNOSIS — K9049 Malabsorption due to intolerance, not elsewhere classified: Secondary | ICD-10-CM

## 2023-05-29 MED ORDER — EPINEPHRINE 0.3 MG/0.3ML IJ SOAJ: 0.3000 mg | INTRAMUSCULAR | 1 refills | Status: AC | PRN

## 2023-05-29 NOTE — Patient Instructions (Addendum)
Today's skin testing: positive to grass, ragweed, weed, trees, dust mites, dog. Borderline to mold, horse. Negative to wheat, milk and egg.   Results given.  Environmental allergies Start environmental control measures as below. Use over the counter antihistamines such as Zyrtec (cetirizine), Claritin (loratadine), Allegra (fexofenadine), or Xyzal (levocetirizine) daily as needed. May take twice a day during allergy flares. May switch antihistamines every few months. Nasal saline spray (i.e., Simply Saline) or nasal saline lavage (i.e., NeilMed) is recommended as needed. Consider allergy injections for long term control if above medications do not help the symptoms - handout given.   Rash/alpha gal allergy Possibly from alpha gal allergy. Continue to avoid all mammalian meat - handout given. I have prescribed epinephrine injectable device and demonstrated proper use. For mild symptoms you can take over the counter antihistamines such as Benadryl 1-2 tablets = 25-50mg  and monitor symptoms closely. If symptoms worsen or if you have severe symptoms including breathing issues, throat closure, significant swelling, whole body hives, severe diarrhea and vomiting, lightheadedness then inject epinephrine and seek immediate medical care afterwards. Emergency action plan given.  If you start to break out again then:  Start zyrtec (cetirizine) 10mg  OR allegra (fexofenadine) 180mg  twice a day. If symptoms are not controlled or causes drowsiness let us know. Start Pepcid (famotidine) 20mg  twice a day.   Avoid the following potential triggers: alcohol, tight clothing, NSAIDs, hot showers and getting overheated. Continue proper skin care.  Use triamcinolone 0.1% ointment twice a day as needed for rash flares. Do not use on the face, neck, armpits or groin area. Do not use more than 3 weeks in a row.   Food intolerance Limit egg intake.   Lactose intolerance May use lactose free milk or take a lactaid  pill right before consuming anything with dairy.  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  Return in about 6 months (around 11/26/2023). Or sooner if needed.  Reducing Pollen Exposure Pollen seasons: trees (spring), grass (summer) and ragweed/weeds (fall). Keep windows closed in your home and car to lower pollen exposure.  Install air conditioning in the bedroom and throughout the house if possible.  Avoid going out in dry windy days - especially early morning. Pollen counts are highest between 5 - 10 AM and on dry, hot and windy days.  Save outside activities for late afternoon or after a heavy rain, when pollen levels are lower.  Avoid mowing of grass if you have grass pollen allergy. Be aware that pollen can also be transported indoors on people and pets.  Dry your clothes in an automatic dryer rather than hanging them outside where they might collect pollen.  Rinse hair and eyes before bedtime.  Control of House Dust Mite Allergen Dust mite allergens are a common trigger of allergy and asthma symptoms. While they can be found throughout the house, these microscopic creatures thrive in warm, humid environments such as bedding, upholstered furniture and carpeting. Because so much time is spent in the bedroom, it is essential to reduce mite levels there.  Encase pillows, mattresses, and box springs in special allergen-proof fabric covers or airtight, zippered plastic covers.  Bedding should be washed weekly in hot water (130 F) and dried in a hot dryer. Allergen-proof covers are available for comforters and pillows that can't be regularly washed.  Wash the allergy-proof covers every few months. Minimize clutter in the bedroom. Keep pets out of the bedroom.  Keep humidity less than 50% by using a dehumidifier or air conditioning. You  can buy a humidity measuring device called a hygrometer to monitor this.  If possible, replace carpets with hardwood, linoleum, or washable area rugs. If that's  not possible, vacuum frequently with a vacuum that has a HEPA filter. Remove all upholstered furniture and non-washable window drapes from the bedroom. Remove all non-washable stuffed toys from the bedroom.  Wash stuffed toys weekly. Pet Allergen Avoidance: Contrary to popular opinion, there are no "hypoallergenic" breeds of dogs or cats. That is because people are not allergic to an animal's hair, but to an allergen found in the animal's saliva, dander (dead skin flakes) or urine. Pet allergy symptoms typically occur within minutes. For some people, symptoms can build up and become most severe 8 to 12 hours after contact with the animal. People with severe allergies can experience reactions in public places if dander has been transported on the pet owners' clothing. Keeping an animal outdoors is only a partial solution, since homes with pets in the yard still have higher concentrations of animal allergens. Before getting a pet, ask your allergist to determine if you are allergic to animals. If your pet is already considered part of your family, try to minimize contact and keep the pet out of the bedroom and other rooms where you spend a great deal of time. As with dust mites, vacuum carpets often or replace carpet with a hardwood floor, tile or linoleum. High-efficiency particulate air (HEPA) cleaners can reduce allergen levels over time. While dander and saliva are the source of cat and dog allergens, urine is the source of allergens from rabbits, hamsters, mice and [place] pigs; so ask a non-allergic family member to clean the animal's cage. If you have a pet allergy, talk to your allergist about the potential for allergy immunotherapy (allergy shots). This strategy can often provide long-term relief. Mold Control Mold and fungi can grow on a variety of surfaces provided certain temperature and moisture conditions exist.  Outdoor molds grow on plants, decaying vegetation and soil. The major outdoor  mold, Alternaria and Cladosporium, are found in very high numbers during hot and dry conditions. Generally, a late summer - fall peak is seen for common outdoor fungal spores. Rain will temporarily lower outdoor mold spore count, but counts rise rapidly when the rainy period ends. The most important indoor molds are Aspergillus and Penicillium. Dark, humid and poorly ventilated basements are ideal sites for mold growth. The next most common sites of mold growth are the bathroom and the kitchen. Outdoor (Seasonal) Mold Control Use air conditioning and keep windows closed. Avoid exposure to decaying vegetation. Avoid leaf raking. Avoid grain handling. Consider wearing a face mask if working in moldy areas.  Indoor (Perennial) Mold Control  Maintain humidity below 50%. Get rid of mold growth on hard surfaces with water, detergent and, if necessary, 5% bleach (do not mix with other cleaners). Then dry the area completely. If mold covers an area more than 10 square feet, consider hiring an indoor environmental professional. For clothing, washing with soap and water is best. If moldy items cannot be cleaned and dried, throw them away. Remove sources e.g. contaminated carpets. Repair and seal leaking roofs or pipes. Using dehumidifiers in damp basements may be helpful, but empty the water and clean units regularly to prevent mildew from forming. All rooms, especially basements, bathrooms and kitchens, require ventilation and cleaning to deter mold and mildew growth. Avoid carpeting on concrete or damp floors, and storing items in damp areas.   Skin care recommendations  Bath time: Always use lukewarm water. AVOID very hot or cold water. Keep bathing time to 5-10 minutes. Do NOT use bubble bath. Use a mild soap and use just enough to wash the dirty areas. Do NOT scrub skin vigorously.  After bathing, pat dry your skin with a towel. Do NOT rub or scrub the skin.  Moisturizers and prescriptions:   ALWAYS apply moisturizers immediately after bathing (within 3 minutes). This helps to lock-in moisture. Use the moisturizer several times a day over the whole body. Good summer moisturizers include: Aveeno, CeraVe, Cetaphil. Good winter moisturizers include: Aquaphor, Vaseline, Cerave, Cetaphil, Eucerin, Vanicream. When using moisturizers along with medications, the moisturizer should be applied about one hour after applying the medication to prevent diluting effect of the medication or moisturize around where you applied the medications. When not using medications, the moisturizer can be continued twice daily as maintenance.  Laundry and clothing: Avoid laundry products with added color or perfumes. Use unscented hypo-allergenic laundry products such as Tide free, Cheer free & gentle, and All free and clear.  If the skin still seems dry or sensitive, you can try double-rinsing the clothes. Avoid tight or scratchy clothing such as wool. Do not use fabric softeners or dyer sheets.

## 2023-06-08 ENCOUNTER — Other Ambulatory Visit: Payer: Self-pay | Admitting: Allergy

## 2023-08-21 ENCOUNTER — Ambulatory Visit: Payer: 59 | Admitting: Orthopaedic Surgery

## 2023-09-11 ENCOUNTER — Ambulatory Visit: Payer: 59 | Admitting: Orthopaedic Surgery

## 2023-10-02 ENCOUNTER — Ambulatory Visit: Payer: 59 | Admitting: Orthopaedic Surgery

## 2023-11-26 NOTE — Progress Notes (Deleted)
 Follow Up Note  RE: Christine DIEBOLD MRN: [id number] DOB: 1961-09-23 Date of Office Visit: 11/27/2023  Referring provider: [name], MD Primary care provider: [name], MD  Chief Complaint: No chief complaint on file.  History of Present Illness: I had the pleasure of seeing Christine Knapp for a follow up visit at the [facility] on 11/26/2023. She is a 63 y.o. female, who is being followed for allergic rhinitis, rash, allergic reaction, alpha gal allergy, food intolerance, NSAID allergy. Her previous allergy office visit was on 05/29/2023 with Dr. Selena Batten. Today is a regular follow up visit.  Discussed the use of AI scribe software for clinical note transcription with the patient, who gave verbal consent to proceed.  History of Present Illness            ***  Assessment and Plan: Christine Knapp is a 63 y.o. female with: Seasonal allergic rhinitis due to pollen Allergic rhinitis due to animal dander Allergic rhinitis due to dust mite Allergic rhinitis due to mold Past history - Perennial rhino conjunctivitis symptoms. Nasal spray worsen headaches.  Today's skin testing: positive to grass, ragweed, weed, trees, dust mites, dog. Borderline to mold, horse. Start environmental control measures as below. Use over the counter antihistamines such as Zyrtec (cetirizine), Claritin (loratadine), Allegra (fexofenadine), or Xyzal (levocetirizine) daily as needed. May take twice a day during allergy flares. May switch antihistamines every few months. Nasal saline spray (i.e., Simply Saline) or nasal saline lavage (i.e., NeilMed) is recommended as needed. Consider allergy injections for long term control if above medications do not help the symptoms - handout given.  Declined nasal sprays.    Rash and other nonspecific skin eruption Other adverse food reactions, not elsewhere classified, subsequent encounter Allergy to alpha-gal Past history - Breaks out in the  spring/summer about 4 times per year for the past 4 years which only clears up with systemic steroids. 2024 bloodwork (CBC diff, CMP, TSH, ANA, esr, crp, CU, tryptase) all normal; alpha gal positive.  Interim history - no outbreaks but also stopped eating red meat and used antihistamines.  Possibly from alpha gal allergy. Continue to avoid all mammalian meat - handout given. I have prescribed epinephrine injectable device and demonstrated proper use. For mild symptoms you can take over the counter antihistamines such as Benadryl 1-2 tablets = 25-50mg  and monitor symptoms closely. If symptoms worsen or if you have severe symptoms including breathing issues, throat closure, significant swelling, whole body hives, severe diarrhea and vomiting, lightheadedness then inject epinephrine and seek immediate medical care afterwards. Emergency action plan given. If you start to break out again then:  Start zyrtec (cetirizine) 10mg  OR allegra (fexofenadine) 180mg  twice a day. If symptoms are not controlled or causes drowsiness let us know. Start Pepcid (famotidine) 20mg  twice a day.  Avoid the following potential triggers: alcohol, tight clothing, NSAIDs, hot showers and getting overheated. Continue proper skin care.  Use triamcinolone 0.1% ointment twice a day as needed for rash flares. Do not use on the face, neck, armpits or groin area. Do not use more than 3 weeks in a row.    Food intolerance GI issues with milk and egg at times. Concerned about gluten as well. Today's skin testing negative to milk, egg and wheat.  Limit egg intake.  Possible lactose intolerance - may use lactose free milk or take a lactaid pill right before consuming anything with dairy.   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 Assessment and  Plan              No follow-ups on file.  No orders of the defined types were placed in this encounter.  Lab Orders  No laboratory test(s) ordered today     Diagnostics: Spirometry:  Tracings reviewed. Her effort: {Blank single:19197::"Good reproducible efforts.","It was hard to get consistent efforts and there is a question as to whether this reflects a maximal maneuver.","Poor effort, data can not be interpreted."} FVC: ***L FEV1: ***L, ***% predicted FEV1/FVC ratio: ***% Interpretation: {Blank single:19197::"Spirometry consistent with mild obstructive disease","Spirometry consistent with moderate obstructive disease","Spirometry consistent with severe obstructive disease","Spirometry consistent with possible restrictive disease","Spirometry consistent with mixed obstructive and restrictive disease","Spirometry uninterpretable due to technique","Spirometry consistent with normal pattern","No overt abnormalities noted given today's efforts"}.  Please see scanned spirometry results for details.  Skin Testing: {Blank single:19197::"Select foods","Environmental allergy panel","Environmental allergy panel and select foods","Food allergy panel","None","Deferred due to recent antihistamines use"}. *** Results discussed with patient/family.   Medication List:  Current Outpatient Medications  Medication Sig Dispense Refill   atomoxetine (STRATTERA) 80 MG capsule Take 80 mg by mouth every morning.     clonazePAM (KLONOPIN) 1 MG tablet Take 1 mg by mouth at bedtime.     EPINEPHrine 0.3 mg/0.3 mL IJ SOAJ injection Inject 0.3 mg into the muscle as needed for anaphylaxis. 2 each 1   famotidine (PEPCID) 20 MG tablet TAKE 1 TABLET BY MOUTH TWICE A DAY 180 tablet 1   triamcinolone ointment (KENALOG) 0.1 % Apply 1 Application topically 2 (two) times daily as needed (rash flare). Do not use on the face, neck, armpits or groin area. Do not use more than 3 weeks in a row. 30 g 1   No current facility-administered medications for this visit.   Allergies: Allergies  Allergen Reactions   Alpha-Gal     Positive bloodwork   Aspirin Swelling   Celebrex  [Celecoxib] Swelling   Nsaids Swelling   Sulfonamide Derivatives    Zolpidem Tartrate Other (See Comments)    Headaches   I reviewed her past medical history, social history, family history, and environmental history and no significant changes have been reported from her previous visit.  Review of Systems  Constitutional:  Negative for appetite change, chills, fever and unexpected weight change.  HENT:  Positive for congestion, rhinorrhea and sneezing.   Eyes:  Negative for itching.  Respiratory:  Negative for cough, chest tightness, shortness of breath and wheezing.   Cardiovascular:  Negative for chest pain.  Gastrointestinal:  Negative for abdominal pain.  Genitourinary:  Negative for difficulty urinating.  Skin:  Negative for rash.  Allergic/Immunologic: Positive for environmental allergies and food allergies.  Neurological:  Negative for headaches.    Objective: There were no vitals taken for this visit. There is no height or weight on file to calculate BMI. Physical Exam Vitals and nursing note reviewed.  Constitutional:      Appearance: Normal appearance. She is well-developed.  HENT:     Head: Normocephalic and atraumatic.     Right Ear: Tympanic membrane and external ear normal.     Left Ear: Tympanic membrane and external ear normal.     Nose: Nose normal.     Mouth/Throat:     Mouth: Mucous membranes are moist.     Pharynx: Oropharynx is clear.  Eyes:     Conjunctiva/sclera: Conjunctivae normal.  Cardiovascular:     Rate and Rhythm: Normal rate and regular rhythm.     Heart sounds: Normal heart  sounds. No murmur heard.    No friction rub. No gallop.  Pulmonary:     Effort: Pulmonary effort is normal.     Breath sounds: Normal breath sounds. No wheezing, rhonchi or rales.  Musculoskeletal:     Cervical back: Neck supple.  Skin:    General: Skin is warm.     Findings: No rash.  Neurological:     Mental Status: She is alert and oriented to person, place, and  time.  Psychiatric:        Behavior: Behavior normal.    Previous notes and tests were reviewed. The plan was reviewed with the patient/family, and all questions/concerned were addressed.  It was my pleasure to see Christine Knapp today and participate in her care. Please feel free to contact me with any questions or concerns.  Sincerely,  Wyline Mood, DO Allergy & Immunology  [facility] office: [phone] [facility] office: [phone]

## 2023-11-27 ENCOUNTER — Ambulatory Visit: Payer: 59 | Admitting: Allergy

## 2023-11-27 DIAGNOSIS — T781XXD Other adverse food reactions, not elsewhere classified, subsequent encounter: Secondary | ICD-10-CM

## 2023-11-27 DIAGNOSIS — J301 Allergic rhinitis due to pollen: Secondary | ICD-10-CM

## 2023-11-27 DIAGNOSIS — J3081 Allergic rhinitis due to animal (cat) (dog) hair and dander: Secondary | ICD-10-CM

## 2023-11-27 DIAGNOSIS — K9049 Malabsorption due to intolerance, not elsewhere classified: Secondary | ICD-10-CM

## 2023-11-27 DIAGNOSIS — Z886 Allergy status to analgesic agent status: Secondary | ICD-10-CM

## 2023-11-27 DIAGNOSIS — J3089 Other allergic rhinitis: Secondary | ICD-10-CM

## 2023-11-27 DIAGNOSIS — Z91018 Allergy to other foods: Secondary | ICD-10-CM

## 2023-11-27 DIAGNOSIS — J309 Allergic rhinitis, unspecified: Secondary | ICD-10-CM

## 2023-11-28 NOTE — Progress Notes (Signed)
 Follow Up Note  RE: Christine Knapp MRN: [id number] DOB: December 28, 1960 Date of Office Visit: 11/29/2023  Referring provider: [name], MD Primary care provider: [name], MD  Chief Complaint: Rash (NO ISSUES )  History of Present Illness: I had the pleasure of seeing Christine Knapp for a follow up visit at the [facility] on 11/29/2023. She is a 63 y.o. female, who is being followed for allergic rhinitis, rash, adverse food reaction, alpha gal allergy, NSAID allergy. Her previous allergy office visit was on 05/29/2023 with Dr. Selena Batten. Today is a regular follow up visit.  Discussed the use of AI scribe software for clinical note transcription with the patient, who gave verbal consent to proceed.    She was last seen six months ago and was diagnosed with an alpha-gal allergy. Since eliminating red meat from her diet, she has experienced no further allergic reactions or breakouts. She has not had any recent tick bites since her last visit.  She continues to manage her environmental allergies with Zyrtec and Allegra, which she takes regularly. She is allergic to dogs and experiences symptoms when around her pet dog, but has not considered rehoming the dog. She is not around horses.   She has not been consuming much in the way of eggs or milk due to food intolerance. She avoids NSAIDs and has not had any significant changes in her condition since the last visit.      2024 labs "Blood count, kidney function, liver function, electrolytes, thyroid, autoimmune screener, inflammation markers, chronic urticaria index (checks for autoantibodies that trigger mast cells), tryptase (checks for mast cell issues) were all normal which is great.   Alpha gal was positive - start to avoid all mammalian meat.   Continue with antihistamines as discuss at last office visit."  Assessment and Plan: Christine Knapp is a 63 y.o. female with: Seasonal allergic rhinitis due to  pollen Allergic rhinitis due to animal dander Allergic rhinitis due to dust mite Allergic rhinitis due to mold Past history - Perennial rhino conjunctivitis symptoms. Nasal spray worsen headaches. 2024 skin testing positive to grass, ragweed, weed, trees, dust mites, dog. Borderline to mold, horse. Declined nasal sprays in the past. Interim history - interested in starting AIT. Has dog at home. Continue environmental control measures as below. Use over the counter antihistamines such as Zyrtec (cetirizine), Claritin (loratadine), Allegra (fexofenadine), or Xyzal (levocetirizine) daily as needed. May take twice a day during allergy flares. May switch antihistamines every few months. Nasal saline spray (i.e., Simply Saline) or nasal saline lavage (i.e., NeilMed) is recommended as needed. Start allergy injections at [facility] office.  Had a detailed discussion with patient/family that clinical history is suggestive of allergic rhinitis, and may benefit from allergy immunotherapy (AIT). Discussed in detail regarding the dosing, schedule, side effects (mild to moderate local allergic reaction and rarely systemic allergic reactions including anaphylaxis), and benefits (significant improvement in nasal symptoms, seasonal flares of asthma) of immunotherapy with the patient. There is significant time commitment involved with allergy shots, which includes weekly immunotherapy injections for first 9-12 months and then biweekly to monthly injections for 3-5 years. Consent was signed. Let us know when ready start.  Allergy to alpha-gal Past history - Breaks out in the spring/summer about 4 times per year for the past 4 years which only clears up with systemic steroids. 2024 bloodwork (CBC diff, CMP, TSH, ANA, esr, crp, CU, tryptase) all normal; alpha gal positive.  Interim history -  no outbreaks since avoiding mammalian meat. Continue to avoid all mammalian meat. Avoid ticks. For mild symptoms you can take  over the counter antihistamines such as Benadryl 1-2 tablets = 25-50mg  and monitor symptoms closely. If symptoms worsen or if you have severe symptoms including breathing issues, throat closure, significant swelling, whole body hives, severe diarrhea and vomiting, lightheadedness then inject epinephrine and seek immediate medical care afterwards. Emergency action plan in place.  Get bloodwork 1-2 weeks before next appointment.    Food intolerance Past history - GI issues with milk and egg at times. 2024 skin testing negative to milk, egg and wheat.  Limit egg intake.  Possible lactose intolerance - may use lactose free milk or take a lactaid pill right before consuming anything with dairy.   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  Return in about 6 months (around 05/31/2024).  No orders of the defined types were placed in this encounter.  Lab Orders         Alpha-Gal Panel      Diagnostics: None.   Medication List:  Current Outpatient Medications  Medication Sig Dispense Refill   atomoxetine (STRATTERA) 80 MG capsule Take 80 mg by mouth every morning.     clonazePAM (KLONOPIN) 1 MG tablet Take 1 mg by mouth at bedtime.     EPINEPHrine 0.3 mg/0.3 mL IJ SOAJ injection Inject 0.3 mg into the muscle as needed for anaphylaxis. 2 each 1   famotidine (PEPCID) 20 MG tablet TAKE 1 TABLET BY MOUTH TWICE A DAY 180 tablet 1   fexofenadine (ALLEGRA ALLERGY) 180 MG tablet Take 180 mg by mouth daily.     SUMAtriptan (IMITREX) 50 MG tablet Take 50 mg by mouth every 2 (two) hours as needed for migraine or headache.     triamcinolone ointment (KENALOG) 0.1 % Apply 1 Application topically 2 (two) times daily as needed (rash flare). Do not use on the face, neck, armpits or groin area. Do not use more than 3 weeks in a row. 30 g 1   No current facility-administered medications for this visit.   Allergies: Allergies  Allergen Reactions   Alpha-Gal     Positive bloodwork   Aspirin Swelling   Celebrex  [Celecoxib] Swelling   Ibuprofen Swelling   Nsaids Swelling   Sulfonamide Derivatives    Zolpidem Tartrate Other (See Comments)    Headaches   I reviewed her past medical history, social history, family history, and environmental history and no significant changes have been reported from her previous visit.  Review of Systems  Constitutional:  Negative for appetite change, chills, fever and unexpected weight change.  HENT:  Positive for congestion, rhinorrhea and sneezing.   Eyes:  Negative for itching.  Respiratory:  Negative for cough, chest tightness, shortness of breath and wheezing.   Cardiovascular:  Negative for chest pain.  Gastrointestinal:  Negative for abdominal pain.  Genitourinary:  Negative for difficulty urinating.  Skin:  Negative for rash.  Allergic/Immunologic: Positive for environmental allergies and food allergies.  Neurological:  Negative for headaches.    Objective: BP 138/88 (BP Location: Right Arm, Patient Position: Sitting, Cuff Size: Normal)   Pulse 77   Temp 97.8 F (36.6 C) (Temporal)   Resp 18   Ht 5' 6.54" (1.69 m)   Wt 157 lb (71.2 kg)   SpO2 99%   BMI 24.93 kg/m  Body mass index is 24.93 kg/m. Physical Exam Vitals and nursing note reviewed.  Constitutional:      Appearance: Normal  appearance. She is well-developed.  HENT:     Head: Normocephalic and atraumatic.     Right Ear: Tympanic membrane and external ear normal.     Left Ear: Tympanic membrane and external ear normal.     Nose: Nose normal.     Mouth/Throat:     Mouth: Mucous membranes are moist.     Pharynx: Oropharynx is clear.  Eyes:     Conjunctiva/sclera: Conjunctivae normal.  Cardiovascular:     Rate and Rhythm: Normal rate and regular rhythm.     Heart sounds: Normal heart sounds. No murmur heard.    No friction rub. No gallop.  Pulmonary:     Effort: Pulmonary effort is normal.     Breath sounds: Normal breath sounds. No wheezing, rhonchi or rales.  Musculoskeletal:      Cervical back: Neck supple.  Skin:    General: Skin is warm.     Findings: No rash.  Neurological:     Mental Status: She is alert and oriented to person, place, and time.  Psychiatric:        Behavior: Behavior normal.    Previous notes and tests were reviewed. The plan was reviewed with the patient/family, and all questions/concerned were addressed.  It was my pleasure to see Christine Knapp today and participate in her care. Please feel free to contact me with any questions or concerns.  Sincerely,  Wyline Mood, DO Allergy & Immunology  [facility] office: [phone] [facility] office: [phone]

## 2023-11-29 ENCOUNTER — Other Ambulatory Visit: Payer: Self-pay

## 2023-11-29 ENCOUNTER — Ambulatory Visit: Admitting: Allergy

## 2023-11-29 ENCOUNTER — Encounter: Payer: Self-pay | Admitting: Allergy

## 2023-11-29 VITALS — BP 138/88 | HR 77 | Temp 97.8°F | Resp 18 | Ht 66.54 in | Wt 157.0 lb

## 2023-11-29 DIAGNOSIS — J3089 Other allergic rhinitis: Secondary | ICD-10-CM

## 2023-11-29 DIAGNOSIS — Z886 Allergy status to analgesic agent status: Secondary | ICD-10-CM

## 2023-11-29 DIAGNOSIS — T781XXD Other adverse food reactions, not elsewhere classified, subsequent encounter: Secondary | ICD-10-CM

## 2023-11-29 DIAGNOSIS — K9049 Malabsorption due to intolerance, not elsewhere classified: Secondary | ICD-10-CM

## 2023-11-29 DIAGNOSIS — J3081 Allergic rhinitis due to animal (cat) (dog) hair and dander: Secondary | ICD-10-CM

## 2023-11-29 DIAGNOSIS — Z91018 Allergy to other foods: Secondary | ICD-10-CM | POA: Diagnosis not present

## 2023-11-29 DIAGNOSIS — J301 Allergic rhinitis due to pollen: Secondary | ICD-10-CM | POA: Diagnosis not present

## 2023-11-29 DIAGNOSIS — R21 Rash and other nonspecific skin eruption: Secondary | ICD-10-CM

## 2023-11-29 NOTE — Patient Instructions (Addendum)
 Environmental allergies 2024 skin testing positive to grass, ragweed, weed, trees, dust mites, dog. Borderline to mold, horse. Continue environmental control measures as below. Use over the counter antihistamines such as Zyrtec (cetirizine), Claritin (loratadine), Allegra (fexofenadine), or Xyzal (levocetirizine) daily as needed. May take twice a day during allergy flares. May switch antihistamines every few months. Nasal saline spray (i.e., Simply Saline) or nasal saline lavage (i.e., NeilMed) is recommended as needed. Start allergy injections at [facility] office.  Had a detailed discussion with patient/family that clinical history is suggestive of allergic rhinitis, and may benefit from allergy immunotherapy (AIT). Discussed in detail regarding the dosing, schedule, side effects (mild to moderate local allergic reaction and rarely systemic allergic reactions including anaphylaxis), and benefits (significant improvement in nasal symptoms, seasonal flares of asthma) of immunotherapy with the patient. There is significant time commitment involved with allergy shots, which includes weekly immunotherapy injections for first 9-12 months and then biweekly to monthly injections for 3-5 years. Consent was signed. Let us know when ready start.  Alpha gal allergy Continue to avoid all mammalian meat. Avoid ticks. For mild symptoms you can take over the counter antihistamines such as Benadryl 1-2 tablets = 25-50mg  and monitor symptoms closely. If symptoms worsen or if you have severe symptoms including breathing issues, throat closure, significant swelling, whole body hives, severe diarrhea and vomiting, lightheadedness then inject epinephrine and seek immediate medical care afterwards. Emergency action plan in place.  Get bloodwork 1-2 weeks before next appointment.   Food intolerance Limit egg intake.   Lactose intolerance May use lactose free milk or take a lactaid pill right before consuming anything  with dairy.  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  Return in about 6 months (around 05/31/2024). Or sooner if needed.  Reducing Pollen Exposure Pollen seasons: trees (spring), grass (summer) and ragweed/weeds (fall). Keep windows closed in your home and car to lower pollen exposure.  Install air conditioning in the bedroom and throughout the house if possible.  Avoid going out in dry windy days - especially early morning. Pollen counts are highest between 5 - 10 AM and on dry, hot and windy days.  Save outside activities for late afternoon or after a heavy rain, when pollen levels are lower.  Avoid mowing of grass if you have grass pollen allergy. Be aware that pollen can also be transported indoors on people and pets.  Dry your clothes in an automatic dryer rather than hanging them outside where they might collect pollen.  Rinse hair and eyes before bedtime.  Control of House Dust Mite Allergen Dust mite allergens are a common trigger of allergy and asthma symptoms. While they can be found throughout the house, these microscopic creatures thrive in warm, humid environments such as bedding, upholstered furniture and carpeting. Because so much time is spent in the bedroom, it is essential to reduce mite levels there.  Encase pillows, mattresses, and box springs in special allergen-proof fabric covers or airtight, zippered plastic covers.  Bedding should be washed weekly in hot water (130 F) and dried in a hot dryer. Allergen-proof covers are available for comforters and pillows that can't be regularly washed.  Wash the allergy-proof covers every few months. Minimize clutter in the bedroom. Keep pets out of the bedroom.  Keep humidity less than 50% by using a dehumidifier or air conditioning. You can buy a humidity measuring device called a hygrometer to monitor this.  If possible, replace carpets with hardwood, linoleum, or washable area rugs. If that's not possible,  vacuum frequently with a  vacuum that has a HEPA filter. Remove all upholstered furniture and non-washable window drapes from the bedroom. Remove all non-washable stuffed toys from the bedroom.  Wash stuffed toys weekly. Pet Allergen Avoidance: Contrary to popular opinion, there are no "hypoallergenic" breeds of dogs or cats. That is because people are not allergic to an animal's hair, but to an allergen found in the animal's saliva, dander (dead skin flakes) or urine. Pet allergy symptoms typically occur within minutes. For some people, symptoms can build up and become most severe 8 to 12 hours after contact with the animal. People with severe allergies can experience reactions in public places if dander has been transported on the pet owners' clothing. Keeping an animal outdoors is only a partial solution, since homes with pets in the yard still have higher concentrations of animal allergens. Before getting a pet, ask your allergist to determine if you are allergic to animals. If your pet is already considered part of your family, try to minimize contact and keep the pet out of the bedroom and other rooms where you spend a great deal of time. As with dust mites, vacuum carpets often or replace carpet with a hardwood floor, tile or linoleum. High-efficiency particulate air (HEPA) cleaners can reduce allergen levels over time. While dander and saliva are the source of cat and dog allergens, urine is the source of allergens from rabbits, hamsters, mice and [place] pigs; so ask a non-allergic family member to clean the animal's cage. If you have a pet allergy, talk to your allergist about the potential for allergy immunotherapy (allergy shots). This strategy can often provide long-term relief. Mold Control Mold and fungi can grow on a variety of surfaces provided certain temperature and moisture conditions exist.  Outdoor molds grow on plants, decaying vegetation and soil. The major outdoor mold, Alternaria and Cladosporium, are  found in very high numbers during hot and dry conditions. Generally, a late summer - fall peak is seen for common outdoor fungal spores. Rain will temporarily lower outdoor mold spore count, but counts rise rapidly when the rainy period ends. The most important indoor molds are Aspergillus and Penicillium. Dark, humid and poorly ventilated basements are ideal sites for mold growth. The next most common sites of mold growth are the bathroom and the kitchen. Outdoor (Seasonal) Mold Control Use air conditioning and keep windows closed. Avoid exposure to decaying vegetation. Avoid leaf raking. Avoid grain handling. Consider wearing a face mask if working in moldy areas.  Indoor (Perennial) Mold Control  Maintain humidity below 50%. Get rid of mold growth on hard surfaces with water, detergent and, if necessary, 5% bleach (do not mix with other cleaners). Then dry the area completely. If mold covers an area more than 10 square feet, consider hiring an indoor environmental professional. For clothing, washing with soap and water is best. If moldy items cannot be cleaned and dried, throw them away. Remove sources e.g. contaminated carpets. Repair and seal leaking roofs or pipes. Using dehumidifiers in damp basements may be helpful, but empty the water and clean units regularly to prevent mildew from forming. All rooms, especially basements, bathrooms and kitchens, require ventilation and cleaning to deter mold and mildew growth. Avoid carpeting on concrete or damp floors, and storing items in damp areas.   Skin care recommendations  Bath time: Always use lukewarm water. AVOID very hot or cold water. Keep bathing time to 5-10 minutes. Do NOT use bubble bath. Use a mild soap and  use just enough to wash the dirty areas. Do NOT scrub skin vigorously.  After bathing, pat dry your skin with a towel. Do NOT rub or scrub the skin.  Moisturizers and prescriptions:  ALWAYS apply moisturizers immediately  after bathing (within 3 minutes). This helps to lock-in moisture. Use the moisturizer several times a day over the whole body. Good summer moisturizers include: Aveeno, CeraVe, Cetaphil. Good winter moisturizers include: Aquaphor, Vaseline, Cerave, Cetaphil, Eucerin, Vanicream. When using moisturizers along with medications, the moisturizer should be applied about one hour after applying the medication to prevent diluting effect of the medication or moisturize around where you applied the medications. When not using medications, the moisturizer can be continued twice daily as maintenance.  Laundry and clothing: Avoid laundry products with added color or perfumes. Use unscented hypo-allergenic laundry products such as Tide free, Cheer free & gentle, and All free and clear.  If the skin still seems dry or sensitive, you can try double-rinsing the clothes. Avoid tight or scratchy clothing such as wool. Do not use fabric softeners or dyer sheets.

## 2024-01-06 ENCOUNTER — Other Ambulatory Visit: Payer: Self-pay | Admitting: Orthopaedic Surgery

## 2024-01-06 DIAGNOSIS — M5416 Radiculopathy, lumbar region: Secondary | ICD-10-CM

## 2024-01-06 DIAGNOSIS — S22089A Unspecified fracture of T11-T12 vertebra, initial encounter for closed fracture: Secondary | ICD-10-CM

## 2024-01-07 ENCOUNTER — Other Ambulatory Visit: Payer: Self-pay

## 2024-01-07 ENCOUNTER — Other Ambulatory Visit (HOSPITAL_COMMUNITY): Payer: Self-pay | Admitting: Medical

## 2024-01-07 ENCOUNTER — Encounter (HOSPITAL_BASED_OUTPATIENT_CLINIC_OR_DEPARTMENT_OTHER): Payer: Self-pay | Admitting: Emergency Medicine

## 2024-01-07 ENCOUNTER — Emergency Department (HOSPITAL_BASED_OUTPATIENT_CLINIC_OR_DEPARTMENT_OTHER)
Admission: EM | Admit: 2024-01-07 | Discharge: 2024-01-07 | Discharge status: Against Medical Advice | Attending: Emergency Medicine | Admitting: Emergency Medicine

## 2024-01-07 DIAGNOSIS — M545 Low back pain, unspecified: Secondary | ICD-10-CM | POA: Diagnosis present

## 2024-01-07 DIAGNOSIS — W11XXXA Fall on and from ladder, initial encounter: Secondary | ICD-10-CM | POA: Diagnosis not present

## 2024-01-07 DIAGNOSIS — Z5321 Procedure and treatment not carried out due to patient leaving prior to being seen by health care provider: Secondary | ICD-10-CM | POA: Diagnosis not present

## 2024-01-07 NOTE — ED Triage Notes (Signed)
 Christine Knapp backwards off ladder last Tuesday. Approx 6 foot fall. Seen at [facility] the next day and dx w/ compression fractures. Scheduled for MRI in may. States pain has since traveled from lower back to LLQ. Wearing back brace from home. Ambulatory.. No deficits.

## 2024-01-08 ENCOUNTER — Ambulatory Visit (HOSPITAL_COMMUNITY)
Admission: RE | Admit: 2024-01-08 | Discharge: 2024-01-08 | Disposition: A | Discharge status: Home / Self Care | Source: Ambulatory Visit | Attending: Medical | Admitting: Medical

## 2024-01-08 DIAGNOSIS — M545 Low back pain, unspecified: Secondary | ICD-10-CM | POA: Diagnosis present

## 2024-01-19 ENCOUNTER — Other Ambulatory Visit: Payer: Self-pay | Admitting: Allergy

## 2024-01-24 ENCOUNTER — Other Ambulatory Visit

## 2024-03-26 ENCOUNTER — Other Ambulatory Visit: Payer: Self-pay | Admitting: Obstetrics and Gynecology

## 2024-03-26 DIAGNOSIS — Z0000 Encounter for general adult medical examination without abnormal findings: Secondary | ICD-10-CM

## 2024-05-26 ENCOUNTER — Ambulatory Visit
Admission: RE | Admit: 2024-05-26 | Discharge: 2024-05-26 | Disposition: A | Discharge status: Home / Self Care | Source: Ambulatory Visit | Attending: Obstetrics and Gynecology | Admitting: Obstetrics and Gynecology

## 2024-05-26 DIAGNOSIS — Z0000 Encounter for general adult medical examination without abnormal findings: Secondary | ICD-10-CM

## 2024-06-01 ENCOUNTER — Other Ambulatory Visit: Payer: Self-pay

## 2024-06-01 ENCOUNTER — Ambulatory Visit (INDEPENDENT_AMBULATORY_CARE_PROVIDER_SITE_OTHER): Admitting: Allergy

## 2024-06-01 ENCOUNTER — Encounter: Payer: Self-pay | Admitting: Allergy

## 2024-06-01 VITALS — BP 130/88 | HR 70 | Temp 98.0°F | Resp 19

## 2024-06-01 DIAGNOSIS — K121 Other forms of stomatitis: Secondary | ICD-10-CM

## 2024-06-01 DIAGNOSIS — Z91018 Allergy to other foods: Secondary | ICD-10-CM | POA: Diagnosis not present

## 2024-06-01 DIAGNOSIS — J301 Allergic rhinitis due to pollen: Secondary | ICD-10-CM

## 2024-06-01 DIAGNOSIS — J3081 Allergic rhinitis due to animal (cat) (dog) hair and dander: Secondary | ICD-10-CM

## 2024-06-01 DIAGNOSIS — Z886 Allergy status to analgesic agent status: Secondary | ICD-10-CM

## 2024-06-01 DIAGNOSIS — K9049 Malabsorption due to intolerance, not elsewhere classified: Secondary | ICD-10-CM

## 2024-06-01 DIAGNOSIS — J3089 Other allergic rhinitis: Secondary | ICD-10-CM | POA: Diagnosis not present

## 2024-06-01 NOTE — Patient Instructions (Addendum)
 Environmental allergies 2024 skin testing positive to grass, ragweed, weed, trees, dust mites, dog. Borderline to mold, horse. Continue environmental control measures as below. Use over the counter antihistamines such as Zyrtec (cetirizine), Claritin (loratadine), Allegra (fexofenadine), or Xyzal (levocetirizine) daily as needed. May take twice a day during allergy  flares. May switch antihistamines every few months. Nasal saline spray (i.e., Simply Saline) or nasal saline lavage (i.e., NeilMed) is recommended as needed. Recommend allergy  injections. 2 injections.  Let us  know when ready to start.  Had a detailed discussion with patient/family that clinical history is suggestive of allergic rhinitis, and may benefit from allergy  immunotherapy (AIT). Discussed in detail regarding the dosing, schedule, side effects (mild to moderate local allergic reaction and rarely systemic allergic reactions including anaphylaxis), and benefits (significant improvement in nasal symptoms, seasonal flares of asthma) of immunotherapy with the patient. There is significant time commitment involved with allergy  shots, which includes weekly immunotherapy injections for first 9-12 months and then biweekly to monthly injections for 3-5 years. Consent was signed before.   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 Avoid ticks. For mild symptoms you can take over the counter antihistamines (zyrtec 10mg  to 20mg ) and monitor symptoms closely.  If symptoms worsen or if you have severe symptoms including breathing issues, throat closure, significant swelling, whole body hives, severe diarrhea and vomiting, lightheadedness then use epinephrine  and seek immediate medical care afterwards. Emergency action plan in place. Get bloodwork.   Food intolerance Limit egg intake.   Lactose intolerance May use lactose free milk or take a lactaid pill right before consuming anything with dairy.  Allergy  to NSAIDs Continue  to avoid.  Oral ulcers May use over the counter canker sore medications.   Return in about 6 months (around 11/29/2024). Or sooner if needed.  Alpha-gal and Red Meat Allergy    Overview An allergy  to "alpha-gal" refers to having a severe and potentially life-threatening allergy  to a carbohydrate molecule called galactose-alpha-1,3-galactose that is found in most mammalian or "red meat". Unlike other food allergies which typically occur within minutes of ingestion, symptoms from eating red meat such as pork, lamb or beef may be delayed, occurring 3-8 hours after eating. Most food allergies are directed against a protein molecule, but alpha-gal is unusual because it is a carbohydrate, and a delay in its absorption may explain the delay in symptoms.  Helpful website: [employer]ikerFestival[employer]is  What are the symptoms of an alpha-gal allergy ? As with other food allergies, signs or symptoms of an allergy  to alpha-gal may include: Hives and itching  Swelling of your lips, face or eyelids  Shortness of breath, cough or wheezing  Abdominal pain, nausea, diarrhea or vomiting The most severe reaction, anaphylaxis, can present as a combination of several of these symptoms, may include low blood pressure, and is potentially fatal.  Because these symptoms are delayed, you may only wake up with them in the middle of the night after an evening meal.  How is an alpha-gal allergy  diagnosed? Diagnosis of this allergy  starts with your allergist taking an appropriate history and physical examination. Because the onset is usually quite delayed, it can be hard to associate the symptoms with eating red meat many hours previously. Triggers include any red meat - including beef, pork, lamb or even horse products. It may occur after eating hotdogs and hamburgers. In very rare cases the reaction may extend to milk or dairy proteins and gelatin.  Your allergist may recommend testing that includes skin tests to the  relevant  animal proteins and blood tests which measure the levels of a specific immunoglobulin E (IgE) antibody, to mammalian meats. An investigational blood test, IgE against alpha-gal itself, may also aid in the diagnosis.  How is an alpha-gal allergy  treated? Immediate symptoms such as hives or shortness of breath are treated the same as any other food allergy  - in an urgent care setting with anti-histamines, epinephrine  and other medications. Prevention long-term involves avoidance of all red meat in sensitized individuals. You may be advised to carry an epinephrine  auto-injector, to be used in case of subsequent accidental exposures and reaction. These measures do not necessarily mean switching to a full vegetarian diet, since poultry and fish can be consumed and do not cause similar reactions. As with other food allergies, there is the possibility that over time the sensitivity diminishes - although these changes may take many years to become apparent.  How do you become allergic to alpha-gal? Alpha-gal is a molecule carried in the saliva of the [place] tick and other potential arthropods typically after feeding on mammalian blood. People that are bitten by the tick, especially those that are bitten repeatedly, are at risk of becoming sensitized and producing the IgE necessary to then cause allergic reactions. Interestingly, allergic reactions may occur to red meat, to subsequent tick bites, and even to medications that contain alpha-gal. Cetuximab is a cancer medication that contains alpha-gal, and people who have had allergic reactions to this medication (these are typically immediate reactions, because it is infused intravenously) have a higher risk for red meat allergy  and are likely to have been bitten by ticks in the past. As might be expected, the incidence of tick bites is much higher in the [place] the traditional habitat for the tick. However, cases are now increasingly  reported in the [place] and [place]. And it is a phenomenon that has been observed worldwide, with different ticks responsible for similar cases of red meat allergy  in many other countries such as [place], [place] and [place].  The discovery of this peculiar allergy  has allowed researchers to correlate tick bites with many cases of anaphylaxis that would previously have been classified as 'idiopathic', or of unknown cause. Also, while it was originally thought that the [employer] tick had to feast on mammalian blood in order to carry the alpha-gal molecule, more recent research has shown that it may carry this molecule and be capable of sensitizing humans independently.  How do you prevent an alpha-gal allergy ? Because this allergy  is predominantly tick born, you are more likely at risk if you often go outdoors in wooded areas for activities such as hiking, fishing or hunting. The key strategy is to prevent tick bites. This may include wearing long sleeved shirts or pants, using appropriate insect repellants, and surveying for ticks after spending time outdoors. Any observed ticks should be removed carefully by cleaning the site with rubbing alcohol, then using tweezers to pull the tick's head up carefully from the skin using steady pressure. Clean your hands and the site one more time and make sure not to crush the tick between your fingers.

## 2024-06-01 NOTE — Progress Notes (Signed)
 Follow Up Note  RE: Christine Knapp MRN: [id number] DOB: December 22, 1960 Date of Office Visit: 06/01/2024  Referring provider: Okey Carlin Redbird, MD Primary care provider: Okey Carlin Redbird, MD  Chief Complaint: Food Intolerance (Still adjusting to things she needed to avoid. ), Follow-up, and Allergic Rhinitis  (Still experiencing same symptoms.)  History of Present Illness: I had the pleasure of seeing Christine Knapp for a follow up visit at the [facility] on 06/01/2024. She is a 63 y.o. female, who is being followed for allergic rhinitis, alpha gal allergy , food intolerance and NSAID allergy . Her previous allergy  office visit was on 11/29/2023 with Dr. Luke. Today is a regular follow up visit.  Discussed the use of AI scribe software for clinical note transcription with the patient, who gave verbal consent to proceed.    She generally manages her alpha-gal syndrome by avoiding trigger foods. Recently, she developed a rash lasting a few days after consuming fish seasoned with [place] spice. She is uncertain if the spice was the cause, as she has used it before without issues.  She describes an incident at a [place] where she accidentally consumed beef instead of chicken, leading to vomiting, diarrhea, and abdominal discomfort. She estimates she consumed about half of the serving. She has not had recent blood work for alpha-gal syndrome since a year ago but had a tick bite under her arm three months ago.  Regarding her environmental allergies, she has not started allergy  shots despite signing a consent form earlier in the year. She takes Allegra in the morning but is unsure of its effectiveness, and her symptoms worsen at night. She has a dog at home. She avoids nasal sprays due to past experiences of migraines.  She also experiences mouth ulcers and is seeking recommendations for management.     Assessment and Plan: Christine Knapp is a 64 y.o. female with: Seasonal  allergic rhinitis due to pollen Allergic rhinitis due to animal dander Allergic rhinitis due to dust mite Allergic rhinitis due to mold Past history - Perennial rhino conjunctivitis symptoms. Nasal spray worsen headaches. 2024 skin testing positive to grass, ragweed, weed, trees, dust mites, dog. Borderline to mold, horse. Declined nasal sprays in the past. 1 dog at home.  Interim history - interested in starting AIT as still symptomatic but forgot to call her insurance.  Continue environmental control measures as below. Use over the counter antihistamines such as Zyrtec (cetirizine), Claritin (loratadine), Allegra (fexofenadine), or Xyzal (levocetirizine) daily as needed. May take twice a day during allergy  flares. May switch antihistamines every few months. Nasal saline spray (i.e., Simply Saline) or nasal saline lavage (i.e., NeilMed) is recommended as needed. Recommend allergy  injections. 2 injections.  Let us  know when ready to start.  Had a detailed discussion with patient/family that clinical history is suggestive of allergic rhinitis, and may benefit from allergy  immunotherapy (AIT). Discussed in detail regarding the dosing, schedule, side effects (mild to moderate local allergic reaction and rarely systemic allergic reactions including anaphylaxis), and benefits (significant improvement in nasal symptoms, seasonal flares of asthma) of immunotherapy with the patient. There is significant time commitment involved with allergy  shots, which includes weekly immunotherapy injections for first 9-12 months and then biweekly to monthly injections for 3-5 years. Consent was signed before.    Allergy  to alpha-gal Past history - Breaks out in the spring/summer about 4 times per year for the past 4 years which only clears up with systemic steroids. 2024 bloodwork (CBC  diff, CMP, TSH, ANA, esr, crp, CU, tryptase) all normal; alpha gal positive.  Interim history - accidental ingestion of beef at restaurant  leading to vomiting/diarrhea. 1 recent tick bite as well.  Continue to avoid all mammalian meat. Avoid ticks. For mild symptoms you can take over the counter antihistamines (zyrtec 10mg  to 20mg ) and monitor symptoms closely.  If symptoms worsen or if you have severe symptoms including breathing issues, throat closure, significant swelling, whole body hives, severe diarrhea and vomiting, lightheadedness then use epinephrine  and seek immediate medical care afterwards. Emergency action plan in place. Get bloodwork.   Food intolerance Past history - GI issues with milk and egg at times. 2024 skin testing negative to milk, egg and wheat.  Limit egg intake.  Possible lactose intolerance - may use lactose free milk or take a lactaid pill right before consuming anything with dairy.   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  Oral ulcer May use over the counter canker sore medications.  Advise to avoid acidic and spicy foods. Recommend SLS-free toothpaste.  Return in about 6 months (around 11/29/2024).  No orders of the defined types were placed in this encounter.  Lab Orders         Alpha-Gal Panel      Diagnostics: None.   Medication List:  Current Outpatient Medications  Medication Sig Dispense Refill   atomoxetine (STRATTERA) 80 MG capsule Take 80 mg by mouth every morning.     clonazePAM  (KLONOPIN ) 1 MG tablet Take 1 mg by mouth at bedtime.     cyclobenzaprine (FLEXERIL) 5 MG tablet Take 5-10 mg by mouth every 8 (eight) hours as needed for muscle spasms (pain).     EPINEPHrine  0.3 mg/0.3 mL IJ SOAJ injection Inject 0.3 mg into the muscle as needed for anaphylaxis. 2 each 1   famotidine  (PEPCID ) 20 MG tablet TAKE 1 TABLET BY MOUTH TWICE A DAY 180 tablet 1   fexofenadine (ALLEGRA ALLERGY ) 180 MG tablet Take 180 mg by mouth daily.     SUMAtriptan (IMITREX) 50 MG tablet Take 50 mg by mouth every 2 (two) hours as needed for migraine or headache.     triamcinolone  ointment (KENALOG ) 0.1 %  Apply 1 Application topically 2 (two) times daily as needed (rash flare). Do not use on the face, neck, armpits or groin area. Do not use more than 3 weeks in a row. 30 g 1   No current facility-administered medications for this visit.   Allergies: Allergies  Allergen Reactions   Alpha-Gal     Positive bloodwork   Aspirin Swelling   Celebrex [Celecoxib] Swelling   Ibuprofen Swelling   Nsaids Swelling   Sulfonamide Derivatives    Zolpidem  Tartrate Other (See Comments)    Headaches   I reviewed her past medical history, social history, family history, and environmental history and no significant changes have been reported from her previous visit.  Review of Systems  Constitutional:  Negative for appetite change, chills, fever and unexpected weight change.  HENT:  Positive for congestion, rhinorrhea and sneezing.   Eyes:  Negative for itching.  Respiratory:  Negative for cough, chest tightness, shortness of breath and wheezing.   Cardiovascular:  Negative for chest pain.  Gastrointestinal:  Negative for abdominal pain.  Genitourinary:  Negative for difficulty urinating.  Skin:  Negative for rash.  Allergic/Immunologic: Positive for environmental allergies and food allergies.  Neurological:  Negative for headaches.    Objective: BP 130/88   Pulse 70   Temp 98  F (36.7 C)   Resp 19   SpO2 97%  There is no height or weight on file to calculate BMI. Physical Exam Vitals and nursing note reviewed.  Constitutional:      Appearance: Normal appearance. She is well-developed.  HENT:     Head: Normocephalic and atraumatic.     Right Ear: Tympanic membrane and external ear normal.     Left Ear: Tympanic membrane and external ear normal.     Nose: Nose normal.     Mouth/Throat:     Mouth: Mucous membranes are moist.     Pharynx: Oropharynx is clear.  Eyes:     Conjunctiva/sclera: Conjunctivae normal.  Cardiovascular:     Rate and Rhythm: Normal rate and regular rhythm.      Heart sounds: Normal heart sounds. No murmur heard.    No friction rub. No gallop.  Pulmonary:     Effort: Pulmonary effort is normal.     Breath sounds: Normal breath sounds. No wheezing, rhonchi or rales.  Musculoskeletal:     Cervical back: Neck supple.  Skin:    General: Skin is warm.     Findings: No rash.  Neurological:     Mental Status: She is alert and oriented to person, place, and time.  Psychiatric:        Behavior: Behavior normal.    Previous notes and tests were reviewed. The plan was reviewed with the patient/family, and all questions/concerned were addressed.  It was my pleasure to see Christine Knapp today and participate in her care. Please feel free to contact me with any questions or concerns.  Sincerely,  Orlan Cramp, DO Allergy  & Immunology  [facility]  [place] office: [phone] [facility] [place] office: [phone]

## 2024-06-03 ENCOUNTER — Ambulatory Visit: Payer: Self-pay | Admitting: Allergy

## 2024-06-03 LAB — ALPHA-GAL PANEL
Allergen Lamb IgE: 0.59 kU/L — AB
Beef IgE: 1.67 kU/L — AB
IgE (Immunoglobulin E), Serum: 25 [IU]/mL (ref 6–495)
O215-IgE Alpha-Gal: 3.83 kU/L — AB
Pork IgE: 0.4 kU/L — AB

## 2024-06-10 ENCOUNTER — Other Ambulatory Visit: Payer: Self-pay | Admitting: Allergy

## 2024-06-10 DIAGNOSIS — J3089 Other allergic rhinitis: Secondary | ICD-10-CM | POA: Diagnosis not present

## 2024-06-10 DIAGNOSIS — J301 Allergic rhinitis due to pollen: Secondary | ICD-10-CM | POA: Diagnosis not present

## 2024-06-10 DIAGNOSIS — J3081 Allergic rhinitis due to animal (cat) (dog) hair and dander: Secondary | ICD-10-CM | POA: Diagnosis not present

## 2024-06-10 NOTE — Progress Notes (Signed)
 Aeroallergen Immunotherapy  Ordering Provider: Dr. Orlan Cramp  Patient Details Name: Christine Knapp MRN: [id number] Date of Birth: 1961-02-14  Order 2 of 2  Vial Label: Dm-D  0.5 ml (Volume)  1:10 Concentration -- Dog Epithelia 0.5 ml (Volume)   AU Concentration -- Mite Mix (DF 5,000 & DP 5,000)   1.0  ml Extract Subtotal 4.0  ml Diluent 5.0  ml Maintenance Total  Schedule:  B Blue Vial (1:100,000): Schedule B (6 doses) Yellow Vial (1:10,000): Schedule B (6 doses) Green Vial (1:1,000): Schedule B (6 doses) Red Vial (1:100): Schedule A (14 doses)  Special Instructions: May come in 1-2 times a week during build up as tolerated. Once a week on red vial. Once on red vial #1 0.5cc go every 2 weeks, on red vial #2 0.5cc go every 4 weeks. May build up red vials faster (0.1, 0.3, 0.5).

## 2024-06-10 NOTE — Progress Notes (Signed)
 VIALS MADE 06-10-24

## 2024-06-10 NOTE — Progress Notes (Signed)
 Aeroallergen Immunotherapy  Ordering Provider: Dr. Orlan Cramp  Patient Details Name: Christine Knapp MRN: [id number] Date of Birth: 02/22/1961  Order 1 of 2  Vial Label: G-Rw-W-T  0.3 ml (Volume)  BAU Concentration -- 7 Grass Mix* 100,000 ([place]  Blue, [place], [place], Perennial [place], RedTop, Sweet Vernal, Timothy) 0.2 ml (Volume)  1:20 Concentration -- [place] 0.3 ml (Volume)  BAU Concentration -- [place] 10,000 0.2 ml (Volume)  1:20 Concentration -- [place] 0.3 ml (Volume)  1:20 Concentration -- Ragweed Mix 0.2 ml (Volume)  1:20 Concentration -- [place] Thistle 0.5 ml (Volume)  1:20 Concentration -- Weed Mix* 0.5 ml (Volume)  1:20 Concentration -- Eastern 10 Tree Mix (also Sweet Gum) 0.2 ml (Volume)  1:20 Concentration -- Box Elder 0.2 ml (Volume)  1:20 Concentration -- Red Mulberry 0.2 ml (Volume)  1:20 Concentration -- Sweet Gum   3.1  ml Extract Subtotal 1.9  ml Diluent 5.0  ml Maintenance Total  Schedule:  B Blue Vial (1:100,000): Schedule B (6 doses) Yellow Vial (1:10,000): Schedule B (6 doses) Green Vial (1:1,000): Schedule B (6 doses) Red Vial (1:100): Schedule A (14 doses)  Special Instructions: May come in 1-2 times a week during build up as tolerated. Once a week on red vial. Once on red vial #1 0.5cc go every 2 weeks, on red vial #2 0.5cc go every 4 weeks. May build up red vials faster (0.1, 0.3, 0.5).

## 2024-07-03 ENCOUNTER — Ambulatory Visit (INDEPENDENT_AMBULATORY_CARE_PROVIDER_SITE_OTHER)

## 2024-07-03 DIAGNOSIS — J309 Allergic rhinitis, unspecified: Secondary | ICD-10-CM

## 2024-07-06 ENCOUNTER — Encounter: Payer: Self-pay | Admitting: Allergy

## 2024-07-06 NOTE — Telephone Encounter (Signed)
 Please dilute vials to silver vial and start at silver vial 0.05cc, schedule B.  Thank you.

## 2024-07-09 ENCOUNTER — Ambulatory Visit

## 2024-07-09 DIAGNOSIS — J309 Allergic rhinitis, unspecified: Secondary | ICD-10-CM

## 2024-07-12 NOTE — Telephone Encounter (Signed)
 Christine Knapp,  Please make note. We are stopping AIT due to headaches. Please make a note on flowsheet.  Thank you.

## 2024-07-15 ENCOUNTER — Other Ambulatory Visit: Payer: Self-pay | Admitting: Family Medicine

## 2024-07-27 ENCOUNTER — Encounter: Payer: Self-pay | Admitting: Radiology

## 2024-11-30 ENCOUNTER — Ambulatory Visit: Admitting: Allergy
